# Patient Record
Sex: Male | Born: 1985 | Race: White | Hispanic: No | Marital: Married | Smoking: Former smoker
Health system: Southern US, Community
[De-identification: ages and names within clinical notes are randomized; demographics above are authoritative.]

## PROBLEM LIST (undated history)

## (undated) DIAGNOSIS — S069X9A Unspecified intracranial injury with loss of consciousness of unspecified duration, initial encounter: Secondary | ICD-10-CM

## (undated) DIAGNOSIS — G039 Meningitis, unspecified: Secondary | ICD-10-CM

## (undated) DIAGNOSIS — M549 Dorsalgia, unspecified: Secondary | ICD-10-CM

## (undated) DIAGNOSIS — R569 Unspecified convulsions: Secondary | ICD-10-CM

## (undated) DIAGNOSIS — G8929 Other chronic pain: Secondary | ICD-10-CM

---

## 2006-09-12 ENCOUNTER — Emergency Department: Payer: Self-pay

## 2007-06-23 ENCOUNTER — Emergency Department: Payer: Self-pay | Admitting: Emergency Medicine

## 2009-04-07 DIAGNOSIS — S069XAA Unspecified intracranial injury with loss of consciousness status unknown, initial encounter: Secondary | ICD-10-CM

## 2009-04-07 DIAGNOSIS — S069X9A Unspecified intracranial injury with loss of consciousness of unspecified duration, initial encounter: Secondary | ICD-10-CM

## 2009-04-07 HISTORY — PX: BRAIN SURGERY: SHX531

## 2009-04-07 HISTORY — DX: Unspecified intracranial injury with loss of consciousness status unknown, initial encounter: S06.9XAA

## 2009-04-07 HISTORY — DX: Unspecified intracranial injury with loss of consciousness of unspecified duration, initial encounter: S06.9X9A

## 2009-08-06 ENCOUNTER — Emergency Department: Payer: Self-pay | Admitting: Emergency Medicine

## 2009-08-15 ENCOUNTER — Emergency Department: Payer: Self-pay

## 2010-03-23 ENCOUNTER — Emergency Department: Payer: Self-pay | Admitting: Emergency Medicine

## 2010-05-08 ENCOUNTER — Emergency Department: Payer: Self-pay

## 2012-11-18 IMAGING — CT CT HEAD WITHOUT CONTRAST
2 series · 15 of 30 positions shown, 19 images · non-contrast
Comparison: none

REASON FOR EXAM: recent cranitomy Right - today: slurred speech,
parethesia/weakness on L (Lally Delacruz
COMMENTS:

[Series 2: without · axial · non-contrast · 0.44mm/px · z∈[+480,+604]mm · 13 of 31 slices shown, 17 images]
[im 3/31  brain]
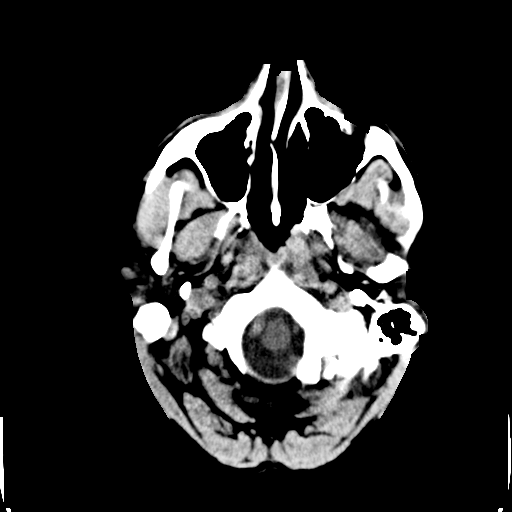
[im 3/31  bone]
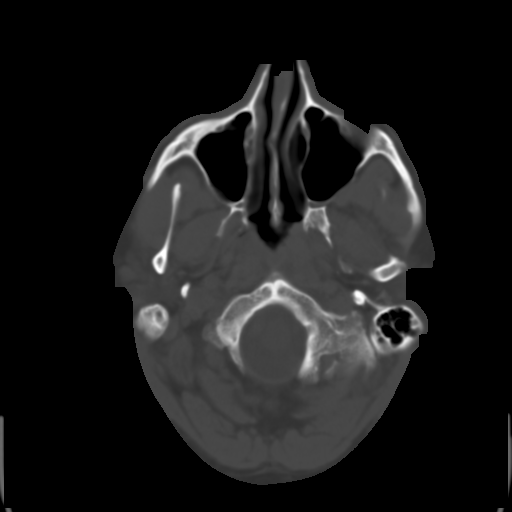
[im 5/31  brain]
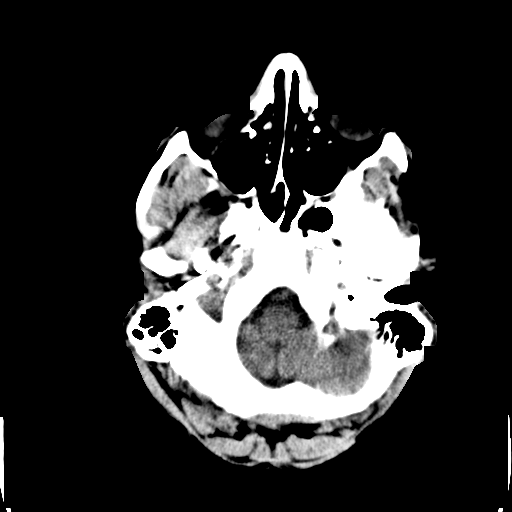
[im 7/31  brain]
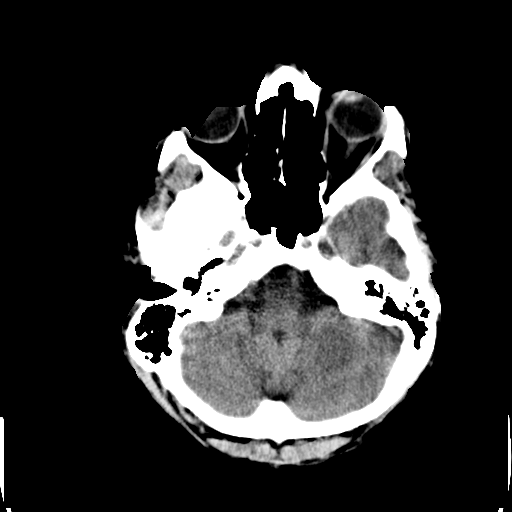
[im 9/31  brain]
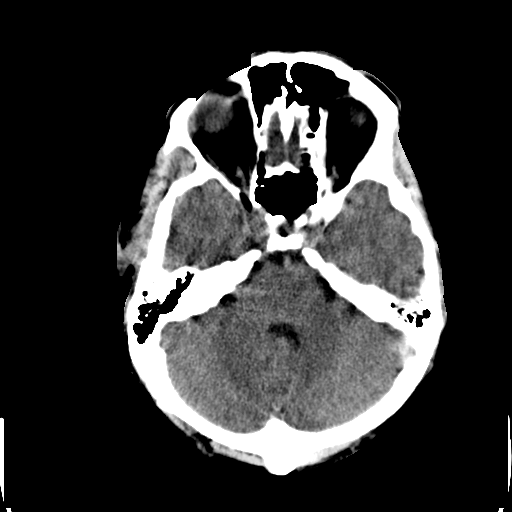
[im 11/31  brain]
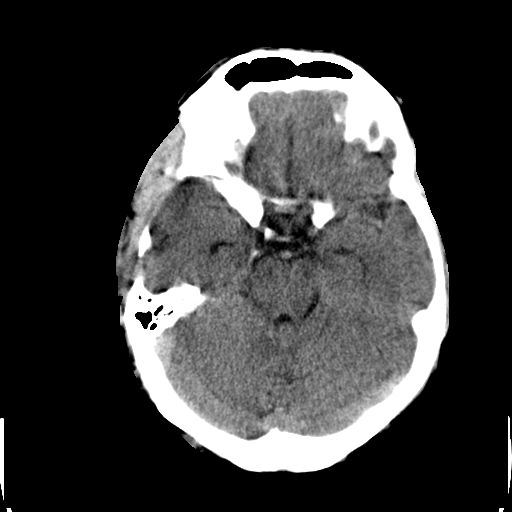
[im 11/31  bone]
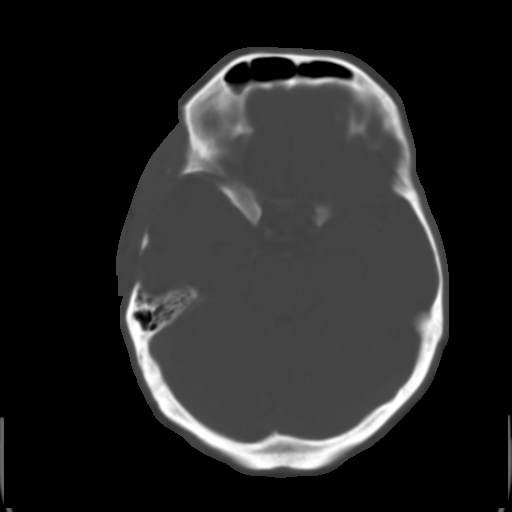
[im 13/31  brain]
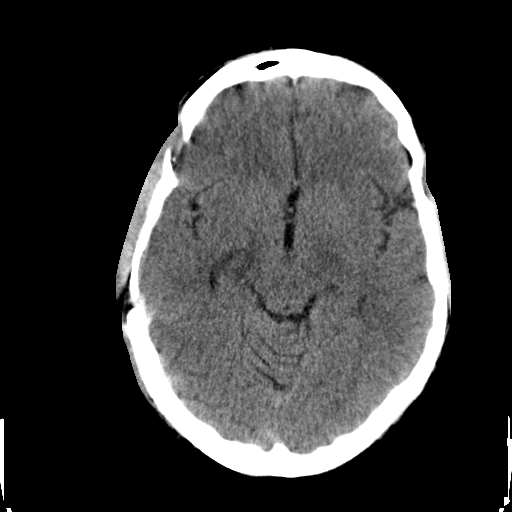
[im 16/31  brain]
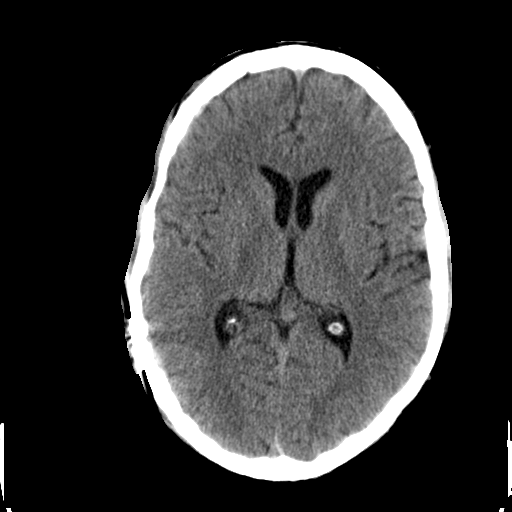
[im 18/31  brain]
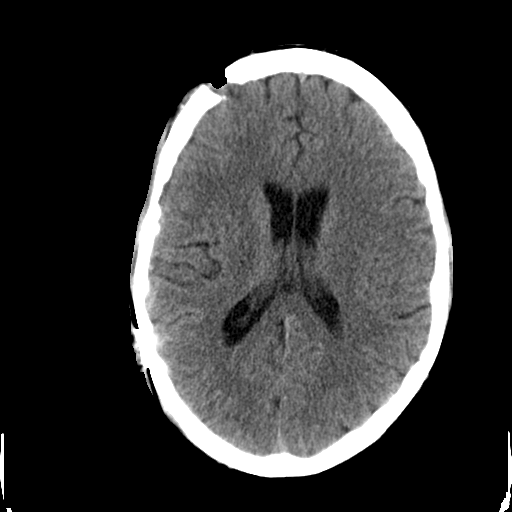
[im 20/31  brain]
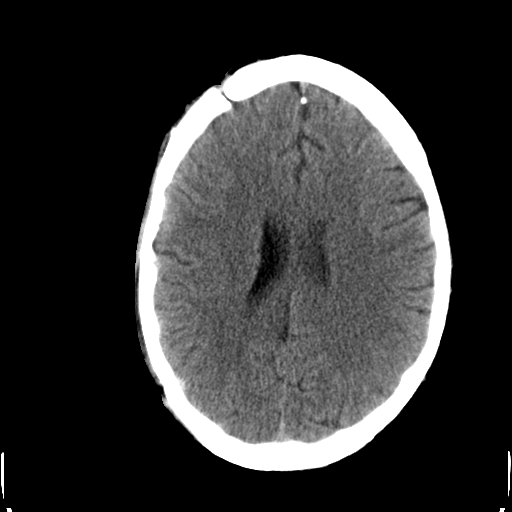
[im 20/31  bone]
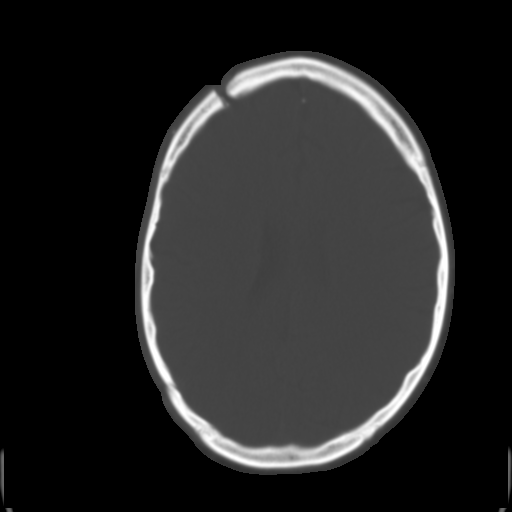
[im 22/31  brain]
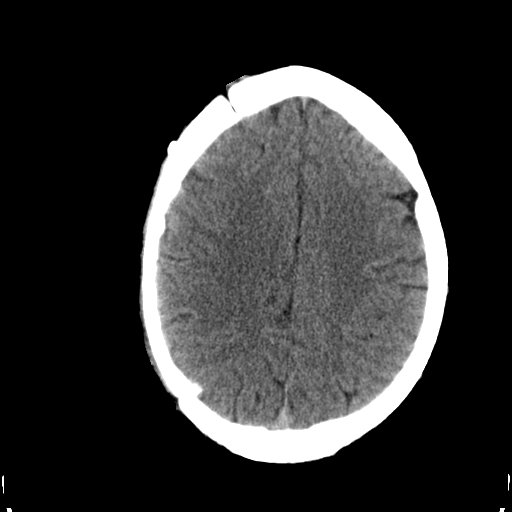
[im 24/31  brain]
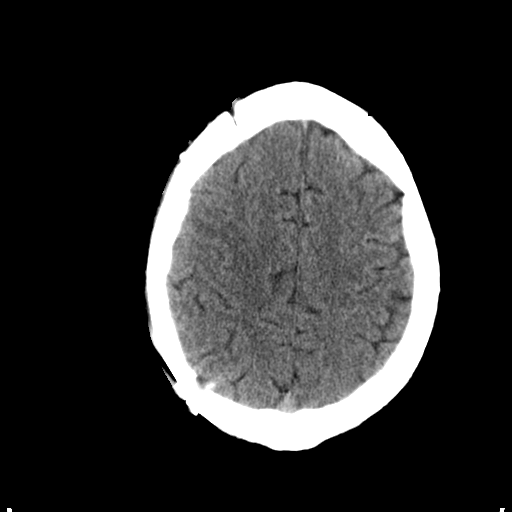
[im 26/31  brain]
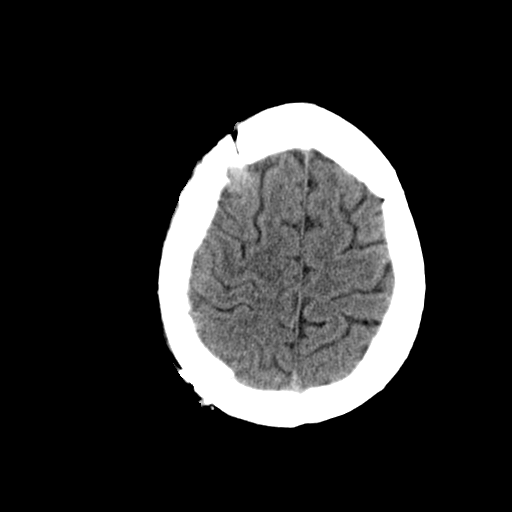
[im 28/31  brain]
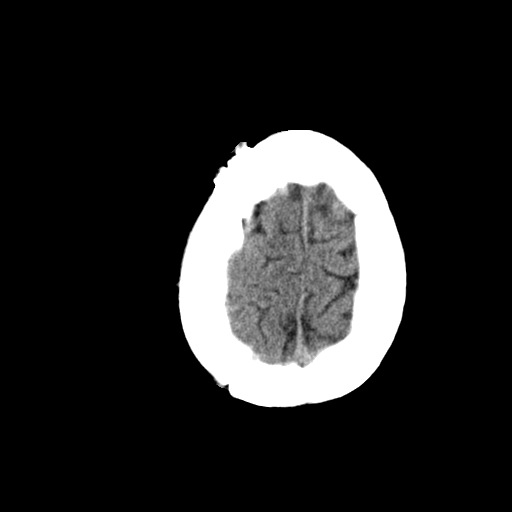
[im 28/31  bone]
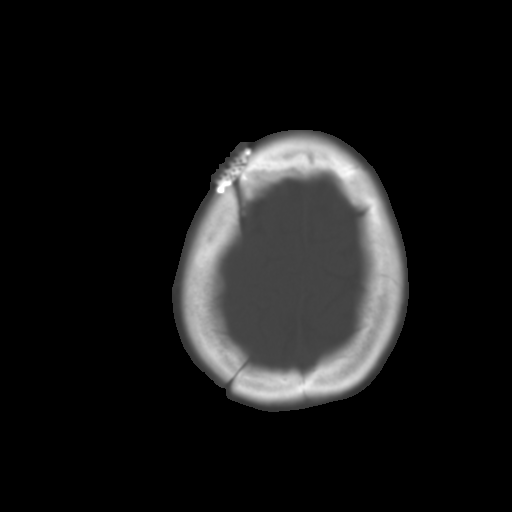

[Series 3: bone · axial · 0.44mm/px · z∈[+480,+500]mm · 2 of 31 slices shown]
[im 3/31  bone]
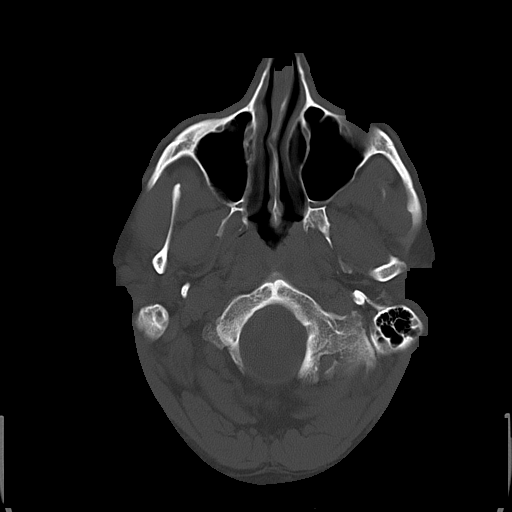
[im 7/31  bone]
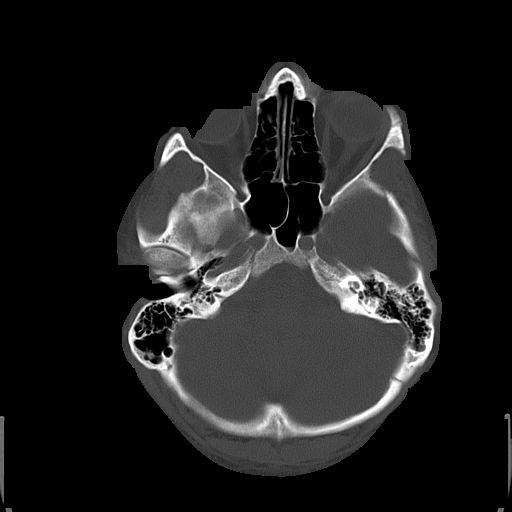

[15 of 30 positions shown; findings below may reference images not displayed]

PROCEDURE:     CT  - CT HEAD WITHOUT CONTRAST  - March 23, 2010 [DATE]

RESULT:     Axial CT scanning was performed through the brain at 5 mm
intervals and slice thicknesses.

The patient has recently undergone surgery at [HOSPITAL] and has evidence of
craniectomy on the right.

The ventricles are normal in size and position. There is no intracranial
hemorrhage. I see no subdural hygroma. I do not see findings to suggest an
evolving ischemic infarction. The cerebellum and brainstem exhibit no acute
abnormality. There is no intracranial mass effect. The calvarium exhibits no
evidence of a depressed fracture. The observed portions of the paranasal
sinuses and mastoid air cells are clear.
IMPRESSION: 1. I do not see evidence of acute intracranial hemorrhage or significant
residual subdural blood.
2. There is no shift of the midline nor hydrocephalus.
3. I see no finding to suggest an evolving ischemic infarction.

## 2012-12-16 ENCOUNTER — Emergency Department: Payer: Self-pay | Admitting: Emergency Medicine

## 2012-12-16 DIAGNOSIS — S0510XA Contusion of eyeball and orbital tissues, unspecified eye, initial encounter: Secondary | ICD-10-CM | POA: Diagnosis not present

## 2012-12-16 DIAGNOSIS — S0230XA Fracture of orbital floor, unspecified side, initial encounter for closed fracture: Secondary | ICD-10-CM | POA: Diagnosis not present

## 2012-12-16 DIAGNOSIS — S0280XA Fracture of other specified skull and facial bones, unspecified side, initial encounter for closed fracture: Secondary | ICD-10-CM | POA: Diagnosis not present

## 2012-12-24 DIAGNOSIS — F919 Conduct disorder, unspecified: Secondary | ICD-10-CM | POA: Diagnosis not present

## 2013-01-19 DIAGNOSIS — F919 Conduct disorder, unspecified: Secondary | ICD-10-CM | POA: Diagnosis not present

## 2013-02-16 DIAGNOSIS — F919 Conduct disorder, unspecified: Secondary | ICD-10-CM | POA: Diagnosis not present

## 2013-03-09 DIAGNOSIS — F919 Conduct disorder, unspecified: Secondary | ICD-10-CM | POA: Diagnosis not present

## 2013-04-27 DIAGNOSIS — F919 Conduct disorder, unspecified: Secondary | ICD-10-CM | POA: Diagnosis not present

## 2013-04-28 DIAGNOSIS — F919 Conduct disorder, unspecified: Secondary | ICD-10-CM | POA: Diagnosis not present

## 2013-07-29 ENCOUNTER — Encounter (HOSPITAL_COMMUNITY): Payer: Self-pay | Admitting: Emergency Medicine

## 2013-07-29 ENCOUNTER — Emergency Department (HOSPITAL_COMMUNITY)
Admission: EM | Admit: 2013-07-29 | Discharge: 2013-07-29 | Disposition: A | Discharge status: Home / Self Care | Payer: Medicare Other | Attending: Emergency Medicine | Admitting: Emergency Medicine

## 2013-07-29 DIAGNOSIS — Y9389 Activity, other specified: Secondary | ICD-10-CM | POA: Insufficient documentation

## 2013-07-29 DIAGNOSIS — IMO0002 Reserved for concepts with insufficient information to code with codable children: Secondary | ICD-10-CM | POA: Insufficient documentation

## 2013-07-29 DIAGNOSIS — M545 Low back pain, unspecified: Secondary | ICD-10-CM | POA: Diagnosis not present

## 2013-07-29 DIAGNOSIS — Z87820 Personal history of traumatic brain injury: Secondary | ICD-10-CM | POA: Diagnosis not present

## 2013-07-29 DIAGNOSIS — G8929 Other chronic pain: Secondary | ICD-10-CM | POA: Insufficient documentation

## 2013-07-29 DIAGNOSIS — Z87891 Personal history of nicotine dependence: Secondary | ICD-10-CM | POA: Diagnosis not present

## 2013-07-29 DIAGNOSIS — Y92410 Unspecified street and highway as the place of occurrence of the external cause: Secondary | ICD-10-CM | POA: Diagnosis not present

## 2013-07-29 DIAGNOSIS — G40909 Epilepsy, unspecified, not intractable, without status epilepticus: Secondary | ICD-10-CM | POA: Diagnosis not present

## 2013-07-29 DIAGNOSIS — M549 Dorsalgia, unspecified: Secondary | ICD-10-CM

## 2013-07-29 HISTORY — DX: Dorsalgia, unspecified: M54.9

## 2013-07-29 HISTORY — DX: Meningitis, unspecified: G03.9

## 2013-07-29 HISTORY — DX: Other chronic pain: G89.29

## 2013-07-29 HISTORY — DX: Unspecified intracranial injury with loss of consciousness of unspecified duration, initial encounter: S06.9X9A

## 2013-07-29 HISTORY — DX: Unspecified convulsions: R56.9

## 2013-07-29 MED ORDER — DIAZEPAM 5 MG PO TABS: 5.0000 mg | ORAL_TABLET | Freq: Two times a day (BID) | ORAL | Status: DC

## 2013-07-29 MED ORDER — NAPROXEN 500 MG PO TABS
500.0000 mg | ORAL_TABLET | Freq: Two times a day (BID) | ORAL | Status: DC
Start: 2013-07-29 — End: 2013-11-12

## 2013-07-29 MED ORDER — NAPROXEN 250 MG PO TABS
500.0000 mg | ORAL_TABLET | Freq: Once | ORAL | Status: AC
  Administered 2013-07-29: 500 mg via ORAL
  Filled 2013-07-29: qty 2

## 2013-07-29 MED ORDER — DIAZEPAM 5 MG PO TABS
10.0000 mg | ORAL_TABLET | Freq: Once | ORAL | Status: AC
  Administered 2013-07-29: 10 mg via ORAL
  Filled 2013-07-29: qty 2
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
## 2013-08-02 NOTE — ED Provider Notes (Signed)
Medical screening examination/treatment/procedure(s) were performed by non-physician practitioner and as supervising physician I was immediately available for consultation/collaboration.   EKG Interpretation None       Marvel Mcphillips, MD 08/02/13 1444 

## 2013-09-30 DIAGNOSIS — Z9889 Other specified postprocedural states: Secondary | ICD-10-CM | POA: Diagnosis not present

## 2013-11-12 ENCOUNTER — Emergency Department (HOSPITAL_COMMUNITY)
Admission: EM | Admit: 2013-11-12 | Discharge: 2013-11-12 | Disposition: A | Discharge status: Home / Self Care | Payer: Medicare Other | Attending: Emergency Medicine | Admitting: Emergency Medicine

## 2013-11-12 ENCOUNTER — Encounter (HOSPITAL_COMMUNITY): Payer: Self-pay | Admitting: Emergency Medicine

## 2013-11-12 ENCOUNTER — Emergency Department (HOSPITAL_COMMUNITY): Payer: Medicare Other

## 2013-11-12 DIAGNOSIS — G8929 Other chronic pain: Secondary | ICD-10-CM | POA: Diagnosis not present

## 2013-11-12 DIAGNOSIS — Z8639 Personal history of other endocrine, nutritional and metabolic disease: Secondary | ICD-10-CM | POA: Insufficient documentation

## 2013-11-12 DIAGNOSIS — Z87891 Personal history of nicotine dependence: Secondary | ICD-10-CM | POA: Insufficient documentation

## 2013-11-12 DIAGNOSIS — Z862 Personal history of diseases of the blood and blood-forming organs and certain disorders involving the immune mechanism: Secondary | ICD-10-CM | POA: Insufficient documentation

## 2013-11-12 DIAGNOSIS — R0989 Other specified symptoms and signs involving the circulatory and respiratory systems: Principal | ICD-10-CM | POA: Insufficient documentation

## 2013-11-12 DIAGNOSIS — R0609 Other forms of dyspnea: Secondary | ICD-10-CM | POA: Diagnosis not present

## 2013-11-12 DIAGNOSIS — R0602 Shortness of breath: Secondary | ICD-10-CM | POA: Diagnosis not present

## 2013-11-12 DIAGNOSIS — Z79899 Other long term (current) drug therapy: Secondary | ICD-10-CM | POA: Diagnosis not present

## 2013-11-12 DIAGNOSIS — Z87820 Personal history of traumatic brain injury: Secondary | ICD-10-CM | POA: Diagnosis not present

## 2013-11-12 DIAGNOSIS — R569 Unspecified convulsions: Secondary | ICD-10-CM | POA: Diagnosis not present

## 2013-11-12 DIAGNOSIS — R0600 Dyspnea, unspecified: Secondary | ICD-10-CM

## 2013-11-12 DIAGNOSIS — R079 Chest pain, unspecified: Secondary | ICD-10-CM | POA: Diagnosis not present

## 2013-11-12 LAB — COMPREHENSIVE METABOLIC PANEL
ALBUMIN: 4.2 g/dL (ref 3.5–5.2)
ALT: 32 U/L (ref 0–53)
ANION GAP: 15 (ref 5–15)
AST: 25 U/L (ref 0–37)
Alkaline Phosphatase: 73 U/L (ref 39–117)
BUN: 20 mg/dL (ref 6–23)
CALCIUM: 9.2 mg/dL (ref 8.4–10.5)
CO2: 22 meq/L (ref 19–32)
CREATININE: 1.12 mg/dL (ref 0.50–1.35)
Chloride: 105 mEq/L (ref 96–112)
GFR calc Af Amer: >90 mL/min (ref >90)
GFR, EST NON AFRICAN AMERICAN: 87 mL/min — AB (ref >90)
Glucose, Bld: 78 mg/dL (ref 70–99)
Potassium: 3.8 mEq/L (ref 3.7–5.3)
Sodium: 142 mEq/L (ref 137–147)
Total Bilirubin: 0.6 mg/dL (ref 0.3–1.2)
Total Protein: 7.3 g/dL (ref 6.0–8.3)

## 2013-11-12 LAB — I-STAT TROPONIN, ED: TROPONIN I, POC: 0 ng/mL (ref 0.00–0.08)

## 2013-11-12 LAB — CBC WITH DIFFERENTIAL/PLATELET
BASOS ABS: 0 10*3/uL (ref 0.0–0.1)
Basophils Relative: 1 % (ref 0–1)
Eosinophils Absolute: 0.2 10*3/uL (ref 0.0–0.7)
Eosinophils Relative: 3 % (ref 0–5)
HCT: 44.8 % (ref 39.0–52.0)
Hemoglobin: 15.4 g/dL (ref 13.0–17.0)
LYMPHS PCT: 28 % (ref 12–46)
Lymphs Abs: 2 10*3/uL (ref 0.7–4.0)
MCH: 30.3 pg (ref 26.0–34.0)
MCHC: 34.4 g/dL (ref 30.0–36.0)
MCV: 88.2 fL (ref 78.0–100.0)
Monocytes Absolute: 0.5 10*3/uL (ref 0.1–1.0)
Monocytes Relative: 7 % (ref 3–12)
NEUTROS ABS: 4.5 10*3/uL (ref 1.7–7.7)
Neutrophils Relative %: 61 % (ref 43–77)
PLATELETS: 199 10*3/uL (ref 150–400)
RBC: 5.08 MIL/uL (ref 4.22–5.81)
RDW: 12.8 % (ref 11.5–15.5)
WBC: 7.3 10*3/uL (ref 4.0–10.5)

## 2013-11-12 NOTE — ED Notes (Signed)
He had a traumatic brain injury in 2011

## 2013-11-12 NOTE — Discharge Instructions (Signed)
Call for a follow up appointment with a Family or Primary Care Provider.  °Return if Symptoms worsen.   °Take medication as prescribed.  ° ° °Emergency Department Resource Guide °1) Find a Doctor and Pay Out of Pocket °Although you won't have to find out who is covered by your insurance plan, it is a good idea to ask around and get recommendations. You will then need to call the office and see if the doctor you have chosen will accept you as a new patient and what types of options they offer for patients who are self-pay. Some doctors offer discounts or will set up payment plans for their patients who do not have insurance, but you will need to ask so you aren't surprised when you get to your appointment. ° °2) Contact Your Local Health Department °Not all health departments have doctors that can see patients for sick visits, but many do, so it is worth a call to see if yours does. If you don't know where your local health department is, you can check in your phone book. The CDC also has a tool to help you locate your state's health department, and many state websites also have listings of all of their local health departments. ° °3) Find a Walk-in Clinic °If your illness is not likely to be very severe or complicated, you may want to try a walk in clinic. These are popping up all over the country in pharmacies, drugstores, and shopping centers. They're usually staffed by nurse practitioners or physician assistants that have been trained to treat common illnesses and complaints. They're usually fairly quick and inexpensive. However, if you have serious medical issues or chronic medical problems, these are probably not your best option. ° °No Primary Care Doctor: °- Call [facility] at  832-8000 - they can help you locate a primary care doctor that  accepts your insurance, provides certain services, etc. °- Physician Referral Service- 1-800-533-3463 ° °Chronic Pain Problems: °Organization         Address  Phone    Notes  °[facility]  (336) 297-2271 Patients need to be referred by their primary care doctor.  ° °Medication Assistance: °Organization         Address  Phone   Notes  °[place] [facility] [address] °[place], [place] [address] (336) 641-8030 --Must be a resident of [place] °-- Must have NO insurance coverage whatsoever (no Medicaid/ Medicare, etc.) °-- The pt. MUST have a primary care doctor that directs their care regularly and follows them in the community °  °[facility]  (866) 331-1348   °[employer]  (888) 892-1162   ° °Agencies that provide inexpensive medical care: °Organization         Address  Phone   Notes  °[facility]  (336) 832-8035   °[facility]    (336) 832-7272   °[facility] [address] °[place], [place] [address] (336) 832-4777   °[facility] [address], °[place] (336) 271-4999   °[facility]    (336) 373-0678   °[facility]    (336) 272-1050   °[facility] ° [address], [place] Phone:  (336) 832-4444, Fax:  (336) 832-4440 Hours of Operation:  9 am - 6 pm, M-F.  Also accepts Medicaid/Medicare and self-pay.  °[facility] ° [address], [place]   Phone: (336) 832-3150, Fax: (336) 832-3151. Hours of Operation:  8:30 am - 5:30 pm, M-F.  Also accepts Medicaid and self-pay.  °[facility] [place] [address], [place] Phone: (336) 878-6027   °[facility] [address], [place], [place] (336)723-1848, Ext. 123 Mondays & Thursdays: 7-9 AM.  First 15 patients are seen on a first come, first serve basis. °  ° °Medicaid-accepting [place] Providers: ° °Organization         Address  Phone   Notes  °[facility] [address], [place] (336) 641-2100 Also accepts self-pay patients.  °[facility]  [address], [place] ° (336) 856-9996   °[facility] [address], [place] (336) 288-8857   °[facility] [address] [place] [address], [place] (336) 299-7000   °Veita Bland [address], [place]  ° (336) 373-1557 Only accepts [place] Access Medicaid patients after they have their name applied to their card.  ° °Self-Pay (no insurance) in [place]: ° °Organization         Address  Phone   Notes  °Sickle Cell Patients, [facility] [address], [place] (336) 832-1970   °[facility] [address], [place] (336) 832-4400   °[facility] ° [address], [place] (336) 992-4800   °[facility]/Dr. Osei-Bonsu ° [address] [place] [address], [place] or [address], [place] (336) 841-8500 Phone number for both [place] and [place] locations is the same.  °[facility] [address], [place] (336) 299-0000   °[facility] [address] [place] [address], [place] or [address] (336) 852-7530 °(336) 878-2260   °[facility] [address], [place] (336) 350-1642, phone; (336) 294-5005, fax Sees patients 1st and 3rd Saturday of every month.  Must not qualify for public or private insurance (i.e. Medicaid, Medicare, [place] Health Choice, Veterans' Benefits) • Household income should be no more than 200% of the poverty level •The clinic cannot treat you if you are pregnant or think you are pregnant • Sexually transmitted diseases are not treated at the clinic.  ° ° °Dental Care: °Organization         Address  Phone  Notes  °[facility] [address], [place] (336) 641-6152 Accepts children up to age 21 who are enrolled in Medicaid or [place] Health Choice; pregnant women with a Medicaid card; and children who have  applied for Medicaid or [place] Health Choice, but were declined, whose parents can pay a reduced fee at time of service.  °[facility] [place]  [address], [place] (336) 641-7733 Accepts children up to age 21 who are enrolled in Medicaid or [place] Health Choice; pregnant women with a Medicaid card; and children who have applied for Medicaid or [place] Health Choice, but were declined, whose parents can pay a reduced fee at time of service.  °[facility] ° [address], [place] (336) 641-4533 Patients are seen by appointment only. Walk-ins are not accepted. [facility] will see patients 18 years of age and older. °Monday - Tuesday (8am-5pm) °Most Wednesdays (8:30-5pm) °$30 per visit, cash only  °[facility] ° [address], [place] (336) 641-4533 Patients are seen by appointment only. Walk-ins are not accepted. [facility] will see patients 18 years of age and older. °One Wednesday Evening (Monthly: Volunteer Based).  $30 per visit, cash only  °[facility]  (919) 537-3737 for adults; Children under age 4, call Graduate Pediatric Dentistry at (919) [phone]. Children aged 4-14, please call (919) [phone] to request a pediatric application. ° Dental services are provided in all areas of dental care including fillings, crowns and bridges, complete and partial dentures, implants, gum treatment, root canals, and extractions. Preventive care is also provided. Treatment is provided to both adults and children. °Patients are selected via a lottery and there is often a waiting list. °  °[facility] [address], °[place] ° (336) 763-8833 [email or web address] °  °[facility] [address], [place], [place] (336)723-1848, Ext. 123 Second and Fourth Thursday of each month, opens at 6:30 AM; Clinic ends at 9 AM.  Patients are seen on a first-come first-served basis, and a  limited number are seen during each clinic.  ° °[facility] ° [address], [place], [place] (336) 723-7904   Eligibility Requirements °You must have lived in [place], [place], or [place] counties for at least the last three months. °  You cannot be eligible for state or federal sponsored healthcare insurance, including Veterans Administration, Medicaid, or Medicare. °  You generally cannot be eligible for healthcare insurance through your employer.  °  How to apply: °Eligibility screenings are held every Tuesday and Wednesday afternoon from 1:00 pm until 4:00 pm. You do not need an appointment for the interview!  °[facility] [address], [place], [place] 336-631-2330   °[facility]  336-342-8273   °[facility]  336-703-3100   °[facility]  336-570-6415   ° °Behavioral Health Resources in the Community: °Intensive Outpatient Programs °Organization         Address  Phone  Notes  °[facility] [address], [place], [place] 336-878-6098   °[facility] [address], [place], [place] 336-832-9800   °[facility] [address], [place], [place] ° 336-882-2125   °[facility] [address],  °[place], [place] 1-800-853-5163 or 336-641-4981   °Substance Abuse Resources °Organization         Address  Phone  Notes  °[facility]  336-882-2125   °[facility]  336-784-9470   °[facility]  336-285-9073   °[facility]  336-845-3988   °Residential & Outpatient Substance Abuse Program  1-800-659-3381   °Psychological Services °Organization         Address  Phone  Notes  °[facility]  336- 832-9600   °[facility]  336- 378-7881   °[facility] [address], [place] 1-800-853-5163 or 336-641-4981   ° °Mobile Crisis Teams °Organization          Address  Phone  Notes  °[facility], Mobile Crisis Care Unit  1-877-626-1772   °Assertive °[facility] ° [address] [place], [place] 336-834-9664   °Sharon DeEsch [address] °[place] 336-554-5454   ° °Self-Help/Support Groups °Organization         Address  Phone             Notes  °[facility] [place] - variety of   support groups  336- 373-1402 Call for more information  °Narcotics Anonymous (NA), [facility] [address], °[place]  2 meetings at this location  ° °Residential Treatment Programs °Organization         Address  Phone  Notes  °[facility] [address],    °[place]  1-866-801-8205   °[facility] ° [address], [place], [place] 704-293-8524   °[facility] [address], [place] 336-845-3988 Admissions: 8am-3pm M-F  °[facility] [address]    °[place], [place] 336-841-1104   °[facility] [address], [place], [place] 336-379-7146   °[facility] [address]  °[place], [place] 336-285-9073   °[facility] - Intensive Outpatient [address], [place], [place] 336-852-3033   °[facility] [address]  °[place], [place] 1-877-615-2722 or 336-784-9470   °[facility] [address] [place], [place] 336-227-7417 Accepts Medicaid  °[facility] [address]  ° [place] 1-800-659-3381 Substance Abuse/Addiction Treatment  ° °[place] Behavioral Health Resources °Organization         Address  Phone  Notes  °[facility]  (888) 581-9988   °Julie Brannon, PhD [address] [place], [place]   (336) 349-5553 or (336) 951-0000   °[facility]   [address] °[place], [place] (336) 349-4454   °[facility] [address], [place], [place] (336) 342-8316 Insurance/Medicaid/sponsorship  through [facility]  °[facility] [address]                                    [place], [place] (336) 342-8316 Therapy/tele-psych/case  °[facility] [address]  ° [place], [place] (336) 349-2233    °Dr. Arfeen  (336) 349-4544   °[facility] [place]  [employer] [place] [facility] 1) [address], [place] °2) [address], [place] °3)  [address], [place] (336) 349-3220 °(336) 342-7768 ° °(336) 342-8140   °[place] [facility] (336) 342-1394 or (336) 342-3537 (After Hours)    ° °

## 2013-11-12 NOTE — ED Provider Notes (Signed)
CSN: [id number]     Arrival date & time 11/12/13  1907 History   First MD Initiated Contact with Patient 11/12/13 1928     Chief Complaint  Patient presents with  . Shortness of Breath     (Consider location/radiation/quality/duration/timing/severity/associated sxs/prior Treatment) HPI Comments: The patient is a 28 year old male past history to traumatic brain injury presents emergency room chief complaint of worsening shortness of breath for approximately 2-3 days. He reports gradual onset of dyspnea.  He reports left sided chest discomfort today while en route to ED.  He denies fever, chills, cough, palpitations, lower extremity edema. No recent travel, family history or personal history of DVT/PE, lower extremity swelling, smoking (last tobacco use 4 years), cancer, or exogenous estrogen.   Patient is a 28 y.o. male presenting with shortness of breath. The history is provided by the patient. No language interpreter was used.  Shortness of Breath Associated symptoms: chest pain   Associated symptoms: no abdominal pain and no fever     Past Medical History  Diagnosis Date  . TBI (traumatic brain injury) 2011  . Meningitis     at age 89  . Chronic back pain   . 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 Brain surgery  2011   No family history on file. History  Substance Use Topics  . Smoking status: Former Games developermoker  . Smokeless tobacco: Not on file  . Alcohol Use: No    Review of Systems  Constitutional: Negative for fever and chills.  Respiratory: Positive for shortness of breath.   Cardiovascular: Positive for chest pain. Negative for palpitations and leg swelling.  Gastrointestinal: Negative for abdominal pain.      Allergies  Review of patient's allergies indicates no known allergies.  Home Medications   Prior to Admission medications   Medication Sig Start Date End Date Taking? Authorizing Provider  albuterol (PROVENTIL  HFA;VENTOLIN HFA) 108 (90 BASE) MCG/ACT inhaler Inhale 1-2 puffs into the lungs every 6 (six) hours as needed for wheezing or shortness of breath.   Yes Historical Provider, MD  donepezil (ARICEPT) 10 MG tablet Take 10 mg by mouth daily.    Yes Historical Provider, MD  levETIRAcetam (KEPPRA) 500 MG tablet Take 1,000 mg by mouth 2 (two) times daily.    Yes Historical Provider, MD   BP 119/77  Pulse 84  Temp(Src) 98.4 F (36.9 C) (Oral)  Resp 18  SpO2 98% Physical Exam  Nursing note and vitals reviewed. Constitutional: He appears well-developed and well-nourished. No distress.  HENT:  Head: Normocephalic.  Large well healed scar to right midsagittal scalp.  Neck: Neck supple.  Cardiovascular: Normal rate and regular rhythm.   No lower extremity edema  Pulmonary/Chest: Tachypnea noted. No respiratory distress. He has decreased breath sounds in the right middle field and the right lower field. He has no wheezes. He has no rhonchi. He exhibits no tenderness.  Patient is able to speak in short sentences. Respiration rate 20-24 while in room.    Abdominal: Soft. There is no tenderness. There is no rebound and no guarding.  Skin: Skin is warm and dry. He is not diaphoretic.  Psychiatric: He has a normal mood and affect.    ED Course  Procedures (including critical care time) Labs Review Labs Reviewed  COMPREHENSIVE METABOLIC PANEL - Abnormal; Notable for the following:    GFR calc non Af Amer 87 (*)    All other components  within normal limits  CBC WITH DIFFERENTIAL  I-STAT TROPOININ, ED    Imaging Review Dg Chest 2 View  11/12/2013   CLINICAL DATA:  Shortness of breath, left inferior chest pain for 3 days, quit smoking 4 years ago  EXAM: CHEST  2 VIEW  COMPARISON:  None.  FINDINGS: The heart size and mediastinal contours are within normal limits. Both lungs are clear. The visualized skeletal structures are unremarkable.  IMPRESSION: No active cardiopulmonary disease.   Electronically  Signed   By: Esperanza Heir M.D.   On: 11/12/2013 21:47     Date: 11/12/2013  Rate: 78  Rhythm: normal sinus rhythm  QRS Axis: normal  Intervals: normal  ST/T Wave abnormalities: normal  Conduction Disutrbances:none  Narrative Interpretation:   Old EKG Reviewed:        MDM   Final diagnoses:  Dyspnea   Patient presents with shortness of breath, right lower and middle lung fields with decrease breath sounds. X-ray ordered to evaluate for possible pleural effusion. Patient is able to speak in short sentences SpO2 99-100% on room air. PERC negative. Negative workup. X-ray without correlating findings. The patient was also evaluated by Dr. Romeo Apple during this encounter. Patient resting comfortably in room, reports resolution of symptoms at this time. Speaking in complete and full sentences. Advised patient followup with PCP. Discussed lab results, imaging results, and treatment plan with the patient. Return precautions given. Reports understanding and no other concerns at this time.  Patient is stable for discharge at this time.    Mellody Drown, PA-C 11/13/13 [phone]

## 2013-11-12 NOTE — ED Notes (Signed)
PT monitored by pulse ox, bp cuff, and 12-lead. 

## 2013-11-12 NOTE — ED Notes (Signed)
The pt is c/o sob for 2-3 days.  The pt is hyperventilating and c/o being dizzy he is not speaking his girlfriend is talking for him..George Ingram

## 2013-11-13 NOTE — ED Provider Notes (Signed)
Medical screening examination/treatment/procedure(s) were conducted as a shared visit with non-physician practitioner(s) and myself.  I personally evaluated the patient during the encounter.   EKG Interpretation None      I interviewed and examined the patient. Lungs are CTAB on my exam. Cardiac exam wnl. Abdomen soft.  Workup non-contrib. Wells/Perc neg. Will rec f/u w/ pcp.   Purvis SheffieldForrest Neils [place], MD 11/13/13 [phone]

## 2014-01-23 ENCOUNTER — Emergency Department (HOSPITAL_COMMUNITY): Payer: No Typology Code available for payment source

## 2014-01-23 ENCOUNTER — Encounter (HOSPITAL_COMMUNITY): Payer: Self-pay | Admitting: Emergency Medicine

## 2014-01-23 ENCOUNTER — Emergency Department (HOSPITAL_COMMUNITY)
Admission: EM | Admit: 2014-01-23 | Discharge: 2014-01-23 | Disposition: A | Discharge status: Home / Self Care | Payer: No Typology Code available for payment source | Attending: Emergency Medicine | Admitting: Emergency Medicine

## 2014-01-23 DIAGNOSIS — Z87891 Personal history of nicotine dependence: Secondary | ICD-10-CM | POA: Diagnosis not present

## 2014-01-23 DIAGNOSIS — G8929 Other chronic pain: Secondary | ICD-10-CM | POA: Insufficient documentation

## 2014-01-23 DIAGNOSIS — M25511 Pain in right shoulder: Secondary | ICD-10-CM | POA: Diagnosis not present

## 2014-01-23 DIAGNOSIS — G40909 Epilepsy, unspecified, not intractable, without status epilepticus: Secondary | ICD-10-CM | POA: Insufficient documentation

## 2014-01-23 DIAGNOSIS — S39012A Strain of muscle, fascia and tendon of lower back, initial encounter: Secondary | ICD-10-CM | POA: Insufficient documentation

## 2014-01-23 DIAGNOSIS — S4991XA Unspecified injury of right shoulder and upper arm, initial encounter: Secondary | ICD-10-CM | POA: Diagnosis not present

## 2014-01-23 DIAGNOSIS — Y9389 Activity, other specified: Secondary | ICD-10-CM | POA: Diagnosis not present

## 2014-01-23 DIAGNOSIS — Z87820 Personal history of traumatic brain injury: Secondary | ICD-10-CM | POA: Insufficient documentation

## 2014-01-23 DIAGNOSIS — M546 Pain in thoracic spine: Secondary | ICD-10-CM | POA: Diagnosis not present

## 2014-01-23 DIAGNOSIS — Y92410 Unspecified street and highway as the place of occurrence of the external cause: Secondary | ICD-10-CM | POA: Diagnosis not present

## 2014-01-23 DIAGNOSIS — Z79899 Other long term (current) drug therapy: Secondary | ICD-10-CM | POA: Insufficient documentation

## 2014-01-23 DIAGNOSIS — R079 Chest pain, unspecified: Secondary | ICD-10-CM | POA: Diagnosis not present

## 2014-01-23 DIAGNOSIS — M549 Dorsalgia, unspecified: Secondary | ICD-10-CM | POA: Diagnosis not present

## 2014-01-23 DIAGNOSIS — S3992XA Unspecified injury of lower back, initial encounter: Secondary | ICD-10-CM | POA: Diagnosis not present

## 2014-01-23 DIAGNOSIS — S161XXA Strain of muscle, fascia and tendon at neck level, initial encounter: Secondary | ICD-10-CM

## 2014-01-23 DIAGNOSIS — S199XXA Unspecified injury of neck, initial encounter: Secondary | ICD-10-CM | POA: Diagnosis present

## 2014-01-23 MED ORDER — IBUPROFEN 400 MG PO TABS
400.0000 mg | ORAL_TABLET | Freq: Once | ORAL | Status: AC
  Administered 2014-01-23: 400 mg via ORAL

## 2014-01-23 MED ORDER — IBUPROFEN 400 MG PO TABS: 400.0000 mg | ORAL_TABLET | Freq: Three times a day (TID) | ORAL | Status: DC | PRN

## 2014-01-23 MED ORDER — CYCLOBENZAPRINE HCL 10 MG PO TABS: 10.0000 mg | ORAL_TABLET | Freq: Two times a day (BID) | ORAL | Status: AC | PRN
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
## 2014-01-23 NOTE — ED Notes (Signed)
Will call registration because patient has concerns about insurance coverage.

## 2014-01-23 NOTE — Discharge Instructions (Signed)
Please call your doctor for a followup appointment within 24-48 hours. When you talk to your doctor please let them know that you were seen in the emergency department and have them acquire all of your records so that they can discuss the findings with you and formulate a treatment plan to fully care for your new and ongoing problems. Please call and set-up an appointment with Health and Wellness center  Please call and set-up an appointment with Orthopedics Please rest and stay hydrated Please take medications as prescribed and on a full stomach  While taking muscle relaxers there is to be no drinking alcohol, driving, operating any heavy machinery for these medications lead to drowsiness Please continue to monitor symptoms closely and if symptoms are to worsen or change (fever greater than 101, chills, sweating, nausea, vomiting, chest pain, shortness of breathe, difficulty breathing, weakness, numbness, tingling, worsening or changes to pain pattern, fall, injury, inability to control urine or bowel movements) please report back to the Emergency Department immediately.   Cervical Sprain A cervical sprain is when the tissues (ligaments) that hold the neck bones in place stretch or tear. HOME CARE   Put ice on the injured area.  Put ice in a plastic bag.  Place a towel between your skin and the bag.  Leave the ice on for 15-20 minutes, 3-4 times a day.  You may have been given a collar to wear. This collar keeps your neck from moving while you heal.  Do not take the collar off unless told by your doctor.  If you have long hair, keep it outside of the collar.  Ask your doctor before changing the position of your collar. You may need to change its position over time to make it more comfortable.  If you are allowed to take off the collar for cleaning or bathing, follow your doctor's instructions on how to do it safely.  Keep your collar clean by wiping it with mild soap and water. Dry it  completely. If the collar has removable pads, remove them every 1-2 days to hand wash them with soap and water. Allow them to air dry. They should be dry before you wear them in the collar.  Do not drive while wearing the collar.  Only take medicine as told by your doctor.  Keep all doctor visits as told.  Keep all physical therapy visits as told.  Adjust your work station so that you have good posture while you work.  Avoid positions and activities that make your problems worse.  Warm up and stretch before being active. GET HELP IF:  Your pain is not controlled with medicine.  You cannot take less pain medicine over time as planned.  Your activity level does not improve as expected. GET HELP RIGHT AWAY IF:   You are bleeding.  Your stomach is upset.  You have an allergic reaction to your medicine.  You develop new problems that you cannot explain.  You lose feeling (become numb) or you cannot move any part of your body (paralysis).  You have tingling or weakness in any part of your body.  Your symptoms get worse. Symptoms include:  Pain, soreness, stiffness, puffiness (swelling), or a burning feeling in your neck.  Pain when your neck is touched.  Shoulder or upper back pain.  Limited ability to move your neck.  Headache.  Dizziness.  Your hands or arms feel week, lose feeling, or tingle.  Muscle spasms.  Difficulty swallowing or chewing. MAKE SURE  YOU:   Understand these instructions.  Will watch your condition.  Will get help right away if you are not doing well or get worse. Document Released: 09/10/2007 Document Revised: 11/24/2012 Document Reviewed: 09/29/2012 [facility] Patient Information 2015 [place], [place]. This information is not intended to replace advice given to you by your health care provider. Make sure you discuss any questions you have with your health care provider.   Back Pain, Adult Back pain is very common. The pain often gets  better over time. The cause of back pain is usually not dangerous. Most people can learn to manage their back pain on their own.  HOME CARE   Stay active. Start with short walks on flat ground if you can. Try to walk farther each day.  Do not sit, drive, or stand in one place for more than 30 minutes. Do not stay in bed.  Do not avoid exercise or work. Activity can help your back heal faster.  Be careful when you bend or lift an object. Bend at your knees, keep the object close to you, and do not twist.  Sleep on a firm mattress. Lie on your side, and bend your knees. If you lie on your back, put a pillow under your knees.  Only take medicines as told by your doctor.  Put ice on the injured area.  Put ice in a plastic bag.  Place a towel between your skin and the bag.  Leave the ice on for 15-20 minutes, 03-04 times a day for the first 2 to 3 days. After that, you can switch between ice and heat packs.  Ask your doctor about back exercises or massage.  Avoid feeling anxious or stressed. Find good ways to deal with stress, such as exercise. GET HELP RIGHT AWAY IF:   Your pain does not go away with rest or medicine.  Your pain does not go away in 1 week.  You have new problems.  You do not feel well.  The pain spreads into your legs.  You cannot control when you poop (bowel movement) or pee (urinate).  Your arms or legs feel weak or lose feeling (numbness).  You feel sick to your stomach (nauseous) or throw up (vomit).  You have belly (abdominal) pain.  You feel like you may pass out (faint). MAKE SURE YOU:   Understand these instructions.  Will watch your condition.  Will get help right away if you are not doing well or get worse. Document Released: 09/10/2007 Document Revised: 06/16/2011 Document Reviewed: 07/26/2013 [facility] Patient Information 2015 [place], [place]. This information is not intended to replace advice given to you by your health care provider.  Make sure you discuss any questions you have with your health care provider.   Emergency Department Resource Guide 1) Find a Doctor and Pay Out of Pocket Although you won't have to find out who is covered by your insurance plan, it is a good idea to ask around and get recommendations. You will then need to call the office and see if the doctor you have chosen will accept you as a new patient and what types of options they offer for patients who are self-pay. Some doctors offer discounts or will set up payment plans for their patients who do not have insurance, but you will need to ask so you aren't surprised when you get to your appointment.  2) Contact Your Local Health Department Not all health departments have doctors that can see patients for sick visits,  but many do, so it is worth a call to see if yours does. If you don't know where your local health department is, you can check in your phone book. The CDC also has a tool to help you locate your state's health department, and many state websites also have listings of all of their local health departments.  3) Find a Walk-in Clinic If your illness is not likely to be very severe or complicated, you may want to try a walk in clinic. These are popping up all over the country in pharmacies, drugstores, and shopping centers. They're usually staffed by nurse practitioners or physician assistants that have been trained to treat common illnesses and complaints. They're usually fairly quick and inexpensive. However, if you have serious medical issues or chronic medical problems, these are probably not your best option.  No Primary Care Doctor: - Call Health Connect at  [phone] - they can help you locate a primary care doctor that  accepts your insurance, provides certain services, etc. - Physician Referral Service- [phone]  Chronic Pain Problems: Organization         Address  Phone   Notes  [facility]  [phone]  Patients need to be referred by their primary care doctor.   Medication Assistance: Organization         Address  Phone   Notes  [facility] Medication [facility] [address] [place] [place], [place] [address] [phone] --Must be a resident of [facility] -- Must have NO insurance coverage whatsoever (no Medicaid/ Medicare, etc.) -- The pt. MUST have a primary care doctor that directs their care regularly and follows them in the community   [facility]  [phone]   [employer]  [phone]    Agencies that provide inexpensive medical care: Organization         Address  Phone   Notes  [facility]  [phone]   [facility]    [phone]   [facility] [address] [place], [place] [address] [phone]   [facility] [address] [place] [place] [phone]   Planned Parenthood    [phone]   [facility]    [phone]   Community Health and [facility]  [address], [place] Phone:  [phone], Fax:  [phone] Hours of Operation:  9 am - 6 pm, M-F.  Also accepts Medicaid/Medicare and self-pay.  [facility]  [address], [place] Phone: [phone], Fax: [phone]. Hours of Operation:  8:30 am - 5:30 pm, M-F.  Also accepts Medicaid and self-pay.  [facility] [place] [address], [place] Phone: [phone]   [facility] [address] Natasha Bence [place], [place] [phone], Ext. [phone] Mondays & Thursdays: 7-9 AM.  First 15 patients are seen on a first come, first serve basis.    Medicaid-accepting [facility] Providers:  Organization         Address  Phone   Notes  [facility] [address], [place] [phone] Also accepts self-pay patients.  [facility] [address] Laurell Josephs [place], [place]  [phone]   [facility] [address], [place] [phone]   [facility] [address] [place] [phone]  Renaye Rakers [address], [place]   [phone] Only accepts [place] patients after they have their name applied to their card.   Self-Pay (no insurance) in [facility]:  Organization         Address  Phone   Notes  Sickle Cell Patients, [facility] [address] [place], [place] [phone]   [facility] [address] [place], [place] [phone]   [facility] [place]  [address], [place] [phone]   [facility]/Dr. Osei-Bonsu  [address] [place] or [address], [place] [phone] Phone number for both [place] and [place] locations is the same.  Urgent Medical and [facility] [address] [place] [phone]   [facility] [address], [place] or [address] [phone]   [facility] [address], [place] [phone], phone; [phone], fax Sees patients 1st and 3rd Saturday of every month.  Must not qualify for public or private insurance (i.e. Medicaid, Medicare, [place] Health Choice, Veterans' Benefits)  Household income should be no more than 200% of the poverty level The clinic cannot treat you if you are pregnant or think you are pregnant  Sexually transmitted diseases are not treated at the clinic.    Dental Care: Organization         Address  Phone  Notes  [facility] [address] [place], [place] [phone] Accepts children up to age 86 who are enrolled in [place] or [place] Health Choice; pregnant women with a Medicaid card; and children who have applied for Medicaid or [place] Health Choice, but were  declined, whose parents can pay a reduced fee at time of service.  [facility] Department of [facility]  [address], [place] [phone] Accepts children up to age 62 who are enrolled in [place] or [place] Health Choice; pregnant women with a Medicaid card; and children who have applied for Medicaid or [place] Health Choice, but were declined, whose parents can pay a reduced fee at time of service.  [facility]  [address] [place], [place] [phone] Patients are seen by appointment only. Walk-ins are not accepted. [facility] will see patients 3 years of age and older. Monday - Tuesday (8am-5pm) Most Wednesdays (8:30-5pm) $30 per visit, cash only  [facility] Adult Dental Access PRO[facility]  [address], [facility] [phone] Patients are seen by appointment only. Walk-ins are not accepted. [facility] will see patients 31 years of age and older. One Wednesday Evening (Monthly: Volunteer Based).  $30 per visit, cash only  [employer]  [phone] for adults; Children under age 12, call Graduate Pediatric Dentistry at [phone]. Children aged 79-14, please call [phone] to request a pediatric application.  Dental services are provided in all areas of dental care including fillings, crowns and bridges, complete and partial dentures, implants, gum treatment, root canals, and extractions. Preventive care is also provided. Treatment is provided to both adults and children. Patients are selected via a lottery and there is often a waiting list.   [facility] [address], [place]  [phone] [email or web address]   [facility] [address] [place], [place] [phone], Ext. [phone]  Second and Fourth Thursday of each month, opens at 6:30 AM; Clinic ends at 9 AM.  Patients are seen on a first-come first-served basis, and a limited number are seen during each clinic.    [facility]  [address] Ether Griffins [place], [place] [phone]   Eligibility Requirements You must have lived in [place], [place], or [place] counties for at least the last three months.   You cannot be eligible for state or federal sponsored [place], including [employer], [place], or [employer].   You generally cannot be eligible for healthcare insurance through your employer.    How to apply: Eligibility screenings are held every Tuesday and Wednesday afternoon from 1:00 pm until 4:00 pm. You do not need an appointment for the interview!  [facility] [address] [place], [place] [phone]   [facility]  [phone]   [facility] Health Department  [phone]   [facility] Health Department  [phone]    Behavioral Health Resources in the Community: Intensive Outpatient Programs Organization         Address  Phone  Notes  [facility] [address] [place], [place] [phone]   [facility] [address] [place], [place] [phone]   [facility] [address], [place], [place]  [phone]   [facility] Mental Health [address],  [place], [place] [phone] or [phone]   Substance Abuse Resources Organization         Address  Phone  Notes  [facility]  [phone]   [facility]  [phone]   [facility] [place]  [phone]   Floydene Flock  [phone]   Residential & Outpatient Substance Abuse Program  [phone]   Psychological Services Organization         Address  Phone  Notes  [facility] Behavioral Health  [phone]   [facility] Services  [phone]   [facility] Mental Health [address] [place] [phone] or [phone]    Mobile Crisis Teams Organization         Address  Phone  Notes  [facility], Mobile Crisis Care  Unit  [phone]   [facility]  [address] [place], [place] [phone]   Doristine Locks [address] [place] [phone]    Self-Help/Support Groups Organization         Address  Phone             Notes  [facility] [place] - variety of support groups  336- I7437963 Call for more information  Narcotics Anonymous (NA), [facility] [address], [employer] [place]  2 meetings at this location   Statistician         Address  Phone  Notes  [facility] [address],    [place]  [phone]   [facility]  [address], [place] [address], [place], [place] [phone]   [facility] [address] [place], [place] [phone] Admissions: 8am-3pm M-F  [facility] [address],    [place], [place] [phone]   [facility] [address] Starling Manns [place], [place] [phone]   [facility] [address],  [place], [place] [phone]   [facility] - Intensive Outpatient [address] Laurell Josephs [address], [place], [place] [phone]   [facility] ([facility]  [address]  [place], [place] [phone] or [phone]   [facility] ([facility]) [address] [place], [place] [phone] Accepts Medicaid  [facility] [address]  [place] [phone] Substance Abuse/Addiction Treatment   [facility] Behavioral Health Resources Organization         Address  Phone  Notes  [facility]  [phone](888) [phone]   Angie FavaJulie Brannon, PhD [address] [place], [place]   [phone]) [phone] or [phone]) [phone]   [facility]   [address] [place], [place] [phone]) [phone]   [facility] [address], [place], [place] [phone]) [phone] Insurance/Medicaid/sponsorship through [facility]  [facility] [address]                                     [place], [place] [phone](336) [phone] Therapy/tele-psych/case  [facility] [address]   [place], [place] [phone](336) [phone]    Dr. Lolly MustacheArfeen  [phone]   [facility] [place]  [employer] [facility] 1) [address], [place] 2) [address], [place] 3)  [address], [place] [phone]) [phone]) [phone]  [phone](336) [phone]   [facility] Child Abuse Hotline [phone](336) 4040937804 or [phone](336) 5142699124 (After Hours)

## 2014-01-26 NOTE — ED Provider Notes (Signed)
Medical screening examination/treatment/procedure(s) were performed by non-physician practitioner and as supervising physician I was immediately available for consultation/collaboration.   EKG Interpretation None       Raeford RazorStephen Marquan Vokes, MD 01/26/14 [phone]

## 2014-04-14 ENCOUNTER — Encounter: Payer: Self-pay | Admitting: Family Medicine

## 2014-04-14 ENCOUNTER — Ambulatory Visit (INDEPENDENT_AMBULATORY_CARE_PROVIDER_SITE_OTHER): Payer: Medicare Other | Admitting: *Deleted

## 2014-04-14 ENCOUNTER — Encounter: Payer: Self-pay | Admitting: *Deleted

## 2014-04-14 ENCOUNTER — Ambulatory Visit (INDEPENDENT_AMBULATORY_CARE_PROVIDER_SITE_OTHER): Payer: Medicare Other | Admitting: Family Medicine

## 2014-04-14 VITALS — BP 125/92 | HR 90 | Temp 97.7°F | Ht 67.0 in | Wt 205.0 lb

## 2014-04-14 DIAGNOSIS — E162 Hypoglycemia, unspecified: Secondary | ICD-10-CM | POA: Diagnosis not present

## 2014-04-14 DIAGNOSIS — Z23 Encounter for immunization: Secondary | ICD-10-CM

## 2014-04-14 DIAGNOSIS — S069X9A Unspecified intracranial injury with loss of consciousness of unspecified duration, initial encounter: Secondary | ICD-10-CM | POA: Insufficient documentation

## 2014-04-14 DIAGNOSIS — S069XAA Unspecified intracranial injury with loss of consciousness status unknown, initial encounter: Secondary | ICD-10-CM | POA: Insufficient documentation

## 2014-04-14 DIAGNOSIS — S069X5S Unspecified intracranial injury with loss of consciousness greater than 24 hours with return to pre-existing conscious level, sequela: Secondary | ICD-10-CM | POA: Diagnosis not present

## 2014-04-14 LAB — GLUCOSE, CAPILLARY: GLUCOSE-CAPILLARY: 85 mg/dL (ref 70–99)

## 2014-04-14 MED ORDER — LEVETIRACETAM 500 MG PO TABS: 500.0000 mg | ORAL_TABLET | Freq: Two times a day (BID) | ORAL | Status: DC

## 2014-04-14 MED ORDER — DONEPEZIL HCL 10 MG PO TABS: 10.0000 mg | ORAL_TABLET | Freq: Every day | ORAL | Status: DC

## 2014-04-14 NOTE — Assessment & Plan Note (Signed)
Unclear etiology in healthy young patient not on insulin. Can consider insulinoma vs autoimmune disease. 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 No long periods of fasting or strenuous exercise. Will obtain POCT glucose, serum insulin and c-peptide and refer to endocrinology for further work up.

## 2014-04-14 NOTE — Assessment & Plan Note (Signed)
Will refill aricept and keppra. Discussed with patient that he may be able to come off keppra since he has not had a seizure in the past 2 months seince being off. Patient would rather continue with therapy for now. Will restart at 500mg  bid and uptitrate to 1000mg  in 2 weeks.

## 2014-04-14 NOTE — Patient Instructions (Addendum)
Thank you for coming to the clinic today. It was nice seeing you.  For your memory, we have refilled your aricept today.  For your low blood sugars, we will draw some labs today and refer you to our endocrinologist.   For your seizures, we will restart your keppra 500mg  twice daily. In 2 weeks, increased to 1000mg  twice daily.   See you again in 1 month.
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
## 2014-04-15 LAB — INSULIN, RANDOM: Insulin: 38.5 u[IU]/mL — ABNORMAL HIGH (ref 2.0–19.6)

## 2014-04-15 LAB — C-PEPTIDE: C-Peptide: 7.18 ng/mL — ABNORMAL HIGH (ref 0.80–3.90)

## 2014-04-17 ENCOUNTER — Telehealth: Payer: Self-pay | Admitting: Family Medicine

## 2014-04-17 NOTE — Telephone Encounter (Signed)
Attempted to call patient RE: test results (elevated random insulin and c-peptide levels). No answer, left VM and callback number. Referral made to endocrinology at our last appointment.   Katina Degreealeb M. Jimmey RalphParker, MD [facility] Resident PGY-1 04/17/2014 3:00 PM

## 2014-04-18 ENCOUNTER — Encounter: Payer: Self-pay | Admitting: Family Medicine

## 2014-04-18 ENCOUNTER — Ambulatory Visit (INDEPENDENT_AMBULATORY_CARE_PROVIDER_SITE_OTHER): Payer: Medicare Other | Admitting: Family Medicine

## 2014-04-18 VITALS — BP 154/96 | HR 79 | Temp 98.6°F | Ht 67.0 in | Wt 200.9 lb

## 2014-04-18 DIAGNOSIS — J0110 Acute frontal sinusitis, unspecified: Secondary | ICD-10-CM

## 2014-04-18 DIAGNOSIS — J019 Acute sinusitis, unspecified: Secondary | ICD-10-CM | POA: Insufficient documentation

## 2014-04-18 DIAGNOSIS — M79671 Pain in right foot: Secondary | ICD-10-CM | POA: Diagnosis not present

## 2014-04-18 MED ORDER — AZITHROMYCIN 250 MG PO TABS: ORAL_TABLET | ORAL | Status: DC

## 2014-04-18 MED ORDER — NAPROXEN 500 MG PO TABS: 500.0000 mg | ORAL_TABLET | Freq: Two times a day (BID) | ORAL | Status: DC

## 2014-04-18 NOTE — Assessment & Plan Note (Signed)
Rhinorrhea, subjective fevers, and facial pain on exam Treat as acute sinusitis with azithromycin Discussed Afrin for rhinorrhea, with a limit of 2 days on Likely underlying viral infection which I discussed with him Follow-up as needed

## 2014-04-18 NOTE — Assessment & Plan Note (Signed)
Arch pain for 3-4 weeks most likely plantar fasciitis However no tenderness at the insertion of the plantar fascia Discussed NSAIDs, stretches, smoldering character of plantar fasciitis Given uncertainty with diagnosis and severe pain will go ahead and ask him to follow-up with sports medicine [facility] written today

## 2014-04-18 NOTE — Telephone Encounter (Signed)
LMOVM for pt to return call .Trystin Terhune Dawn  

## 2014-04-18 NOTE — Patient Instructions (Signed)
It looks like he had a virus infection, start azithromycin for this  He can also try Afrin for no more than 2 days straight, as well as over-the-counter decongestants  For your foot pain, try the ice 2-3 times per night as I described, also try the stretches that I provided for you  You should receive a call from sports medicine for an appointment.

## 2014-04-18 NOTE — Progress Notes (Signed)
Patient ID: George MinorsJoseph Harvey Weisinger, male   DOB: 07/13/1985, 29 y.o.   MRN: [id number]   HPI  Patient presents today for congestion of the pain  Congestion Patient states that starting 2-3 days ago he has had severe rhinorrhea, subjective fevers, headache, and very mild cough. He states that his cough is not persistent he denies dyspnea. He states he's taking food and oral fluids easily, his appetite is declining today. Has several sick contacts at home and at work with similar symptoms. Some of the living treated for pneumonia with azithromycin.  Foot pain Described as right sided intermittent sharp mid arch foot pain worsened the morning and with very specific positions. States that it started about 3-4 weeks ago and has continued intermittently since that time. At this moment it's not bothering him, at times it's a 9 out of 10 pain. He's tried over-the-counter medicines which have not helped. He has not tried any ice, heat, or stretches.  Smoking status noted ROS: Per HPI  Objective: BP 154/96 mmHg  Pulse 79  Temp(Src) 98.6 F (37 C) (Oral)  Ht 5\' 7"  (1.702 m)  Wt 200 lb 14.4 oz (91.128 kg)  BMI 31.46 kg/m2 Gen: NAD, alert, cooperative with exam HEENT: NCAT, left turbinate swollen, oropharynx clear, TMs normal bilaterally CV: RRR, good S1/S2, no murmur Resp: CTABL, no wheezes, non-labored Ext: Right foot with no tenderness to palpation of the arch, and specifically no tenderness to palpation at insertion of plantar fascia, no bony tenderness or external lesions. Grossly normal appearance. Neuro: Alert and oriented, No gross deficits  Assessment and plan:  Arch pain of right foot Arch pain for 3-4 weeks most likely plantar fasciitis However no tenderness at the insertion of the plantar fascia Discussed NSAIDs, stretches, smoldering character of plantar fasciitis Given uncertainty with diagnosis and severe pain will go ahead and ask him to follow-up with sports medicine [facility] written today  Acute sinusitis Rhinorrhea, subjective fevers, and facial pain on exam Treat as acute sinusitis with azithromycin Discussed Afrin for rhinorrhea, with a limit of 2 days on Likely underlying viral infection which I discussed with him Follow-up as needed   Orders Placed This Encounter  Procedures  . Ambulatory referral to Sports Medicine    Referral Priority:  Routine    Referral Type:  Consultation    Number of Visits Requested:  1    Meds ordered this encounter  Medications  . azithromycin (ZITHROMAX) 250 MG tablet    Sig: Take 2 pills on day 1 then 1 pill daily until finished    Dispense:  6 tablet    Refill:  0  . naproxen (NAPROSYN) 500 MG tablet    Sig: Take 1 tablet (500 mg total) by mouth 2 (two) times daily with a meal.    Dispense:  30 tablet    Refill:  0

## 2014-05-02 ENCOUNTER — Ambulatory Visit: Payer: Medicare Other | Admitting: Sports Medicine

## 2014-05-12 ENCOUNTER — Ambulatory Visit: Payer: Medicare Other | Admitting: Endocrinology

## 2014-05-12 ENCOUNTER — Ambulatory Visit (INDEPENDENT_AMBULATORY_CARE_PROVIDER_SITE_OTHER): Payer: Medicare Other | Admitting: Family Medicine

## 2014-05-12 ENCOUNTER — Encounter: Payer: Self-pay | Admitting: Family Medicine

## 2014-05-12 VITALS — BP 128/92 | HR 65 | Temp 98.3°F | Ht 67.0 in | Wt 204.0 lb

## 2014-05-12 DIAGNOSIS — E162 Hypoglycemia, unspecified: Secondary | ICD-10-CM

## 2014-05-12 NOTE — Progress Notes (Signed)
   George MinorsJoseph Harvey Slivinski is a 29 y.o. male who [facility]. His concerns today include:  HPI: Hypoglycemia. Symptoms have been controlled fairly well per patient. Still endorses some symptoms of lightheadedness. No seizures. Tries to eat regularly throughout the day. No diaphoresis or palpitations. No fevers or chills. Has not seen endocrinologist yet.   ROS: As per HPI.   Past Medical History - Reviewed and updated Patient Active Problem List   Diagnosis Date Noted  . Arch pain of right foot 04/18/2014  . Acute sinusitis 04/18/2014  . TBI (traumatic brain injury) 04/14/2014  . Hypoglycemia 04/14/2014   Objective: Physical Exam: BP 128/92 mmHg  Pulse 65  Temp(Src) 98.3 F (36.8 C) (Other (Comment))  Ht 5\' 7"  (1.702 m)  Wt 204 lb (92.534 kg)  BMI 31.94 kg/m2  Gen: NAD, resting comfortably CV: RRR with no murmurs appreciated Lungs: NWOB, CTAB with no crackles, wheezes, or rhonchi Abdomen: Normal bowel sounds present. Soft, Nontender, Nondistended. Ext: no edema Skin: warm, dry Neuro: grossly normal, moves all extremities  A/P: See problem list  Hypoglycemia Symptoms are being fairly well controlled. 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 Patient missed appointment with Endocrinology today. Sent in another referral and asked him to reschedule. Encouraged patient to continue eating regularly. Will follow up after endocrinologist visit.      Orders Placed This Encounter  Procedures  . Ambulatory referral to Endocrinology    Referral Priority:  Routine    Referral Type:  Consultation    Referral Reason:  Specialty Services Required    Number of Visits Requested:  1    Caleb M. Jimmey RalphParker, MD [facility] Resident PGY-1 05/12/2014 6:51 PM

## 2014-05-12 NOTE — Assessment & Plan Note (Addendum)
Symptoms are being fairly well controlled. 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 Patient missed appointment with Endocrinology today. Sent in another referral and asked him to reschedule. Encouraged patient to continue eating regularly. Will follow up after endocrinologist visit.

## 2014-05-12 NOTE — Patient Instructions (Signed)
Thank you for coming to the clinic today. It was nice seeing you.  For your low blood sugar, it sound like you are managing it well. You do have high levels of insulin in your body, which could explain why your sugars are getting to low. Please see the endocrinologist  For further work up.  See you again in 3-6 months.

## 2014-05-15 ENCOUNTER — Ambulatory Visit (INDEPENDENT_AMBULATORY_CARE_PROVIDER_SITE_OTHER): Payer: Medicare Other | Admitting: Sports Medicine

## 2014-05-15 ENCOUNTER — Encounter: Payer: Self-pay | Admitting: Sports Medicine

## 2014-05-15 VITALS — BP 114/83 | Ht 67.0 in | Wt 204.0 lb

## 2014-05-15 DIAGNOSIS — M25571 Pain in right ankle and joints of right foot: Secondary | ICD-10-CM | POA: Diagnosis present

## 2014-05-15 NOTE — Progress Notes (Signed)
   Subjective:    Patient ID: George Ingram, male    DOB: 04/23/1985, 29 y.o.   MRN: [id number]  HPI chief complaint: Right foot pain  Very pleasant 29 year old male comes in today complaining of 2 months of right foot pain. No known trauma. His job requires him to stand for long periods of time. He describes an aching discomfort primarily along the arch of his foot which at times were radiate into his medial ankle. It is worse with certain motions such as twisting or turning on his forefoot which he does repetitively as part of his job. He was seen at the family practice center and placed on an anti-inflammatory and referred to us for further evaluation. He has a pair of soft arch supports which he purchased shortly after his foot started hurting. He denies any pain at the heel. No problems with his foot in the past. No numbness and tingling. He is here today with his fiance. Past medical history reviewed Medications reviewed Allergies reviewed    Review of Systems     Objective:   Physical Exam Well-developed, well-nourished. No acute distress. Awake alert and oriented 3.  Right foot: There is no tenderness to palpation at the calcaneal insertion the plantar fascia. Patient is tender to palpation along the middle portion of his plantar fascia but is also tender to palpation along the insertion of the posterior tibialis tendon. Patient demonstrates pes planus with standing and reproducible pain with resisted foot inversion. Reproducible pain with standing on his tiptoes. No soft tissue swelling. Negative Tinel's at the tarsal tunnel. Neurovascular intact distally.  MSK ultrasound of the right foot was done. This was a limited scan. Plantar fascia was well visualized and appears to be within normal limits without any evidence of tearing or thickening. There is some fluid surrounding the insertion of the posterior tibialis tendon onto the navicular. His is best seen in short view. Findings  consistent with insertional posterior tibialis tendinitis.      Assessment & Plan:  Right foot pain likely secondary to posterior tibialis tendinitis Pes planus  Full body helix ankle compression sleeve to wear at work. Green sports insoles with scaphoid pads to wear in his work shoes. Nightly ice immersion for 10 minutes at a time. Follow-up with me in 4 weeks for reevaluation. If he finds the temporary inserts to be comfortable then we will plan on custom orthotics at his follow-up visit.

## 2014-05-16 ENCOUNTER — Other Ambulatory Visit: Payer: Self-pay | Admitting: *Deleted

## 2014-05-16 DIAGNOSIS — M79671 Pain in right foot: Secondary | ICD-10-CM

## 2014-05-17 MED ORDER — NAPROXEN 500 MG PO TABS: 500.0000 mg | ORAL_TABLET | Freq: Two times a day (BID) | ORAL | Status: DC

## 2014-06-19 ENCOUNTER — Encounter: Payer: Self-pay | Admitting: Sports Medicine

## 2014-06-19 ENCOUNTER — Ambulatory Visit (INDEPENDENT_AMBULATORY_CARE_PROVIDER_SITE_OTHER): Payer: Medicare Other | Admitting: Sports Medicine

## 2014-06-19 VITALS — BP 117/77 | HR 80 | Ht 67.0 in | Wt 204.0 lb

## 2014-06-19 DIAGNOSIS — M25571 Pain in right ankle and joints of right foot: Secondary | ICD-10-CM

## 2014-06-19 NOTE — Progress Notes (Signed)
   Subjective:    Patient ID: George MinorsJoseph Harvey Ingram, male    DOB: 12/25/1985, 29 y.o.   MRN: [id number]  HPI   Patient comes in today for follow-up on right ankle posterior tibialis tendinitis. He has noticed some improvement with the body helix compression sleeve. He is also wearing his green sports insoles with scaphoid pads. He has not been icing. He was previously limping but that has now resolved. He is here today with his fiance.    Review of Systems     Objective:   Physical Exam Well-developed, well-nourished  Right ankle: Full range of motion. No effusion. No soft tissue swelling. There is some tenderness to palpation along the distal posterior tibialis tendon at the insertion onto the navicular. Walking without a limp. Neurovascularly intact distally.       Assessment & Plan:  Improving posterior tibialis tendinitis, right ankle  Continue with body helix compression sleeve. We discussed the possibility of custom orthotics but the patient is concerned that they may be a little too thick although the shoes that he has with him today will certainly accommodate those. Therefore, he will simply stick with his green sports insoles and scaphoid pads and he will return to the office in 4 weeks for reevaluation.

## 2014-07-17 ENCOUNTER — Ambulatory Visit: Payer: Medicare Other | Admitting: Sports Medicine

## 2014-08-07 ENCOUNTER — Ambulatory Visit: Payer: Medicare Other | Admitting: Sports Medicine

## 2014-10-16 ENCOUNTER — Other Ambulatory Visit: Payer: Self-pay | Admitting: *Deleted

## 2014-10-16 DIAGNOSIS — S069X5S Unspecified intracranial injury with loss of consciousness greater than 24 hours with return to pre-existing conscious level, sequela: Secondary | ICD-10-CM

## 2014-10-16 MED ORDER — DONEPEZIL HCL 10 MG PO TABS: 10.0000 mg | ORAL_TABLET | Freq: Every day | ORAL | Status: DC

## 2014-10-31 ENCOUNTER — Other Ambulatory Visit: Payer: Self-pay | Admitting: *Deleted

## 2014-10-31 DIAGNOSIS — S069X5S Unspecified intracranial injury with loss of consciousness greater than 24 hours with return to pre-existing conscious level, sequela: Secondary | ICD-10-CM

## 2014-10-31 MED ORDER — LEVETIRACETAM 500 MG PO TABS: 1000.0000 mg | ORAL_TABLET | Freq: Two times a day (BID) | ORAL | Status: DC

## 2014-12-12 ENCOUNTER — Emergency Department (HOSPITAL_COMMUNITY)
Admission: EM | Admit: 2014-12-12 | Discharge: 2014-12-12 | Disposition: A | Discharge status: Home / Self Care | Payer: Medicare Other | Attending: Emergency Medicine | Admitting: Emergency Medicine

## 2014-12-12 ENCOUNTER — Encounter (HOSPITAL_COMMUNITY): Payer: Self-pay | Admitting: Emergency Medicine

## 2014-12-12 DIAGNOSIS — Z87820 Personal history of traumatic brain injury: Secondary | ICD-10-CM | POA: Insufficient documentation

## 2014-12-12 DIAGNOSIS — J45909 Unspecified asthma, uncomplicated: Secondary | ICD-10-CM | POA: Insufficient documentation

## 2014-12-12 DIAGNOSIS — G40909 Epilepsy, unspecified, not intractable, without status epilepticus: Secondary | ICD-10-CM | POA: Diagnosis not present

## 2014-12-12 DIAGNOSIS — G8929 Other chronic pain: Secondary | ICD-10-CM | POA: Diagnosis not present

## 2014-12-12 DIAGNOSIS — Z791 Long term (current) use of non-steroidal anti-inflammatories (NSAID): Secondary | ICD-10-CM | POA: Diagnosis not present

## 2014-12-12 DIAGNOSIS — Z79899 Other long term (current) drug therapy: Secondary | ICD-10-CM | POA: Insufficient documentation

## 2014-12-12 DIAGNOSIS — J029 Acute pharyngitis, unspecified: Secondary | ICD-10-CM | POA: Diagnosis present

## 2014-12-12 DIAGNOSIS — J4520 Mild intermittent asthma, uncomplicated: Secondary | ICD-10-CM

## 2014-12-12 DIAGNOSIS — Z87891 Personal history of nicotine dependence: Secondary | ICD-10-CM | POA: Diagnosis not present

## 2014-12-12 DIAGNOSIS — J069 Acute upper respiratory infection, unspecified: Secondary | ICD-10-CM | POA: Diagnosis not present

## 2014-12-12 LAB — RAPID STREP SCREEN (MED CTR MEBANE ONLY): STREPTOCOCCUS, GROUP A SCREEN (DIRECT): NEGATIVE

## 2014-12-12 MED ORDER — ALBUTEROL SULFATE HFA 108 (90 BASE) MCG/ACT IN AERS
2.0000 | INHALATION_SPRAY | Freq: Once | RESPIRATORY_TRACT | Status: AC
  Administered 2014-12-12: 2 via RESPIRATORY_TRACT
  Filled 2014-12-12: qty 6.7

## 2014-12-12 MED ORDER — AZITHROMYCIN 250 MG PO TABS: ORAL_TABLET | ORAL | Status: DC

## 2014-12-12 MED ORDER — PREDNISONE 20 MG PO TABS: 40.0000 mg | ORAL_TABLET | Freq: Every day | ORAL | Status: DC

## 2014-12-12 NOTE — Discharge Instructions (Signed)
Bronchospasm °A bronchospasm is a spasm or tightening of the airways going into the lungs. During a bronchospasm breathing becomes more difficult because the airways get smaller. When this happens there can be coughing, a whistling sound when breathing (wheezing), and difficulty breathing. Bronchospasm is often associated with asthma, but not all patients who experience a bronchospasm have asthma. °CAUSES  °A bronchospasm is caused by inflammation or irritation of the airways. The inflammation or irritation may be triggered by:  °· Allergies (such as to animals, pollen, food, or mold). Allergens that cause bronchospasm may cause wheezing immediately after exposure or many hours later.   °· Infection. Viral infections are believed to be the most common cause of bronchospasm.   °· Exercise.   °· Irritants (such as pollution, cigarette smoke, strong odors, aerosol sprays, and paint fumes).   °· Weather changes. Winds increase molds and pollens in the air. Rain refreshes the air by washing irritants out. Cold air may cause inflammation.   °· Stress and emotional upset.   °SIGNS AND SYMPTOMS  °· Wheezing.   °· Excessive nighttime coughing.   °· Frequent or severe coughing with a simple cold.   °· Chest tightness.   °· Shortness of breath.   °DIAGNOSIS  °Bronchospasm is usually diagnosed through a history and physical exam. Tests, such as chest X-rays, are sometimes done to look for other conditions. °TREATMENT  °· Inhaled medicines can be given to open up your airways and help you breathe. The medicines can be given using either an inhaler or a nebulizer machine. °· Corticosteroid medicines may be given for severe bronchospasm, usually when it is associated with asthma. °HOME CARE INSTRUCTIONS  °· Always have a plan prepared for seeking medical care. Know when to call your health care provider and local emergency services (911 in the [place] Know where you can access local emergency care. °· Only take medicines as  directed by your health care provider. °· If you were prescribed an inhaler or nebulizer machine, ask your health care provider to explain how to use it correctly. Always use a spacer with your inhaler if you were given one. °· It is necessary to remain calm during an attack. Try to relax and breathe more slowly.  °· Control your home environment in the following ways:   °· Change your heating and air conditioning filter at least once a month.   °· Limit your use of fireplaces and wood stoves. °· Do not smoke and do not allow smoking in your home.   °· Avoid exposure to perfumes and fragrances.   °· Get rid of pests (such as roaches and mice) and their droppings.   °· Throw away plants if you see mold on them.   °· Keep your house clean and dust free.   °· Replace carpet with wood, tile, or vinyl flooring. Carpet can trap dander and dust.   °· Use allergy-proof pillows, mattress covers, and box spring covers.   °· Wash bed sheets and blankets every week in hot water and dry them in a dryer.   °· Use blankets that are made of polyester or cotton.   °· Wash hands frequently. °SEEK MEDICAL CARE IF:  °· You have muscle aches.   °· You have chest pain.   °· The sputum changes from clear or white to yellow, green, gray, or bloody.   °· The sputum you cough up gets thicker.   °· There are problems that may be related to the medicine you are given, such as a rash, itching, swelling, or trouble breathing.   °SEEK IMMEDIATE MEDICAL CARE IF:  °· You have worsening wheezing and coughing even   after taking your prescribed medicines.   °· You have increased difficulty breathing.   °· You develop severe chest pain. °MAKE SURE YOU:  °· Understand these instructions. °· Will watch your condition. °· Will get help right away if you are not doing well or get worse. °Document Released: 03/27/2003 Document Revised: 03/29/2013 Document Reviewed: 09/13/2012 °Exit[employer]® Patient Information ©2015 [employer]. This information is not  intended to replace advice given to you by your health care provider. Make sure you discuss any questions you have with your health care provider. ° °Upper Respiratory Infection, Adult °An upper respiratory infection (URI) is also sometimes known as the common cold. The upper respiratory tract includes the nose, sinuses, throat, trachea, and bronchi. Bronchi are the airways leading to the lungs. Most people improve within 1 week, but symptoms can last up to 2 weeks. A residual cough may last even longer.  °CAUSES °Many different viruses can infect the tissues lining the upper respiratory tract. The tissues become irritated and inflamed and often become very moist. Mucus production is also common. A cold is contagious. You can easily spread the virus to others by oral contact. This includes kissing, sharing a glass, coughing, or sneezing. Touching your mouth or nose and then touching a surface, which is then touched by another person, can also spread the virus. °SYMPTOMS  °Symptoms typically develop 1 to 3 days after you come in contact with a cold virus. Symptoms vary from person to person. They may include: °· Runny nose. °· Sneezing. °· Nasal congestion. °· Sinus irritation. °· Sore throat. °· Loss of voice (laryngitis). °· Cough. °· Fatigue. °· Muscle aches. °· Loss of appetite. °· Headache. °· Low-grade fever. °DIAGNOSIS  °You might diagnose your own cold based on familiar symptoms, since most people get a cold 2 to 3 times a year. Your caregiver can confirm this based on your exam. Most importantly, your caregiver can check that your symptoms are not due to another disease such as strep throat, sinusitis, pneumonia, asthma, or epiglottitis. Blood tests, throat tests, and X-rays are not necessary to diagnose a common cold, but they may sometimes be helpful in excluding other more serious diseases. Your caregiver will decide if any further tests are required. °RISKS AND COMPLICATIONS  °You may be at risk for a  more severe case of the common cold if you smoke cigarettes, have chronic heart disease (such as heart failure) or lung disease (such as asthma), or if you have a weakened immune system. The very young and very old are also at risk for more serious infections. Bacterial sinusitis, middle ear infections, and bacterial pneumonia can complicate the common cold. The common cold can worsen asthma and chronic obstructive pulmonary disease (COPD). Sometimes, these complications can require emergency medical care and may be life-threatening. °PREVENTION  °The best way to protect against getting a cold is to practice good hygiene. Avoid oral or hand contact with people with cold symptoms. Wash your hands often if contact occurs. There is no clear evidence that vitamin C, vitamin E, echinacea, or exercise reduces the chance of developing a cold. However, it is always recommended to get plenty of rest and practice good nutrition. °TREATMENT  °Treatment is directed at relieving symptoms. There is no cure. Antibiotics are not effective, because the infection is caused by a virus, not by bacteria. Treatment may include: °· Increased fluid intake. Sports drinks offer valuable electrolytes, sugars, and fluids. °· Breathing heated mist or steam (vaporizer or shower). °· Eating chicken soup   or other clear broths, and maintaining good nutrition. °· Getting plenty of rest. °· Using gargles or lozenges for comfort. °· Controlling fevers with ibuprofen or acetaminophen as directed by your caregiver. °· Increasing usage of your inhaler if you have asthma. °Zinc gel and zinc lozenges, taken in the first 24 hours of the common cold, can shorten the duration and lessen the severity of symptoms. Pain medicines may help with fever, muscle aches, and throat pain. A variety of non-prescription medicines are available to treat congestion and runny nose. Your caregiver can make recommendations and may suggest nasal or lung inhalers for other  symptoms.  °HOME CARE INSTRUCTIONS  °· Only take over-the-counter or prescription medicines for pain, discomfort, or fever as directed by your caregiver. °· Use a warm mist humidifier or inhale steam from a shower to increase air moisture. This may keep secretions moist and make it easier to breathe. °· Drink enough water and fluids to keep your urine clear or pale yellow. °· Rest as needed. °· Return to work when your temperature has returned to normal or as your caregiver advises. You may need to stay home longer to avoid infecting others. You can also use a face mask and careful hand washing to prevent spread of the virus. °SEEK MEDICAL CARE IF:  °· After the first few days, you feel you are getting worse rather than better. °· You need your caregiver's advice about medicines to control symptoms. °· You develop chills, worsening shortness of breath, or brown or red sputum. These may be signs of pneumonia. °· You develop yellow or brown nasal discharge or pain in the face, especially when you bend forward. These may be signs of sinusitis. °· You develop a fever, swollen neck glands, pain with swallowing, or white areas in the back of your throat. These may be signs of strep throat. °SEEK IMMEDIATE MEDICAL CARE IF:  °· You have a fever. °· You develop severe or persistent headache, ear pain, sinus pain, or chest pain. °· You develop wheezing, a prolonged cough, cough up blood, or have a change in your usual mucus (if you have chronic lung disease). °· You develop sore muscles or a stiff neck. °Document Released: 09/17/2000 Document Revised: 06/16/2011 Document Reviewed: 06/29/2013 °ExitCare® Patient Information ©2015 [employer]. This information is not intended to replace advice given to you by your health care provider. Make sure you discuss any questions you have with your health care provider. ° °

## 2014-12-12 NOTE — ED Provider Notes (Signed)
CSN: [id number]     Arrival date & time 12/12/14  1317 History  This chart was scribed for non-physician practitioner, Arthor Captain, PA-C, working with Benjiman Core, MD by Freida Busman, ED Scribe. This patient was seen in room WTR7/WTR7 and the patient's care was started at 3:55 PM.  Chief Complaint  Patient presents with  . URI  . Sore Throat   The history is provided by the patient. No language interpreter was used.    HPI Comments:  George Ingram is a 29 y.o. male who presents to the Emergency Department complaining of cold symptoms for about 1 week. He reports sore throat, rhinorrhea and dry cough. He has taken multiple OTC medications without relief. His fiancee has similar symptoms. Pt denies fever. He denies smoking history but notes he lives with a smoker.   Past Medical History  Diagnosis Date  . TBI (traumatic brain injury) 2011  . Meningitis     at age 31  . Chronic back pain   . 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 Brain surgery  2011   Family History  Problem Relation Age of Onset  . Heart disease Brother    Social History  Substance Use Topics  . Smoking status: Former Smoker    Quit date: 04/14/2009  . Smokeless tobacco: None  . Alcohol Use: No    Review of Systems  Constitutional: Negative for fever and chills.  HENT: Positive for rhinorrhea and sore throat.   Respiratory: Positive for cough.     Allergies  Review of patient's allergies indicates no known allergies.  Home Medications   Prior to Admission medications   Medication Sig Start Date End Date Taking? Authorizing Provider  albuterol (PROVENTIL HFA;VENTOLIN HFA) 108 (90 BASE) MCG/ACT inhaler Inhale 1-2 puffs into the lungs every 6 (six) hours as needed for wheezing or shortness of breath.    Historical Provider, MD  azithromycin (ZITHROMAX Z-PAK) 250 MG tablet 2 po day one, then 1 daily x 4 days 12/12/14   Arthor Captain, PA-C  cyclobenzaprine  (FLEXERIL) 10 MG tablet Take 1 tablet (10 mg total) by mouth 2 (two) times daily as needed for muscle spasms. 01/23/14   Marissa Sciacca, PA-C  donepezil (ARICEPT) 10 MG tablet Take 1 tablet (10 mg total) by mouth daily. 10/16/14   Ardith Dark, MD  ibuprofen (ADVIL,MOTRIN) 400 MG tablet Take 1 tablet (400 mg total) by mouth every 8 (eight) hours as needed. 01/23/14   Marissa Sciacca, PA-C  levETIRAcetam (KEPPRA) 500 MG tablet Take 2 tablets (1,000 mg total) by mouth 2 (two) times daily. 10/31/14   Ardith Dark, MD  naproxen (NAPROSYN) 500 MG tablet Take 1 tablet (500 mg total) by mouth 2 (two) times daily with a meal. 05/17/14   Ardith Dark, MD  predniSONE (DELTASONE) 20 MG tablet Take 2 tablets (40 mg total) by mouth daily. 12/12/14   Donnis Phaneuf, PA-C   BP 118/77 mmHg  Pulse 64  Temp(Src) 97.4 F (36.3 C) (Oral)  SpO2 99% Physical Exam Appears moderately ill but not toxic; temperature as noted in vitals. Ears normal. Eyes:glassy appearance, no discharge  Heart: RRR, NO M/G/R Throat and pharynx normal.   Neck supple. No adenopathyhy in the neck.  Sinuses non tender.  The chest is clear. Abdomen is soft and nontender  ED Course  Procedures   DIAGNOSTIC STUDIES:  Oxygen Saturation is 98% on RA, normal by  my interpretation.    COORDINATION OF CARE:  3:58 PM Discussed treatment plan with pt at bedside and pt agreed to plan.  Labs Review Labs Reviewed  RAPID STREP SCREEN (NOT AT [facility])  CULTURE, GROUP A STREP    Imaging Review No results found. I have personally reviewed and evaluated these images and lab results as part of my medical decision-making.   EKG Interpretation None        MDM   Final diagnoses:  URI (upper respiratory infection)  RAD (reactive airway disease), mild intermittent, uncomplicated     Patients symptoms are consistent with URI, likely viral etiology. Discussed that antibiotics are not indicated for viral infections. Pt will be  discharged with symptomatic treatment.  Verbalizes understanding and is agreeable with plan. Pt is hemodynamically stable & in NAD prior to dc.   I personally performed the services described in this documentation, which was scribed in my presence. The recorded information has been reviewed and is accurate.      Arthor Captain, PA-C 12/15/14 1929  Benjiman Core, MD 12/20/14 [phone]

## 2014-12-12 NOTE — ED Notes (Signed)
Per pt, states cold symptoms for about a week, congestion and sore throat

## 2014-12-14 LAB — CULTURE, GROUP A STREP: Strep A Culture: NEGATIVE

## 2015-02-17 ENCOUNTER — Emergency Department (HOSPITAL_COMMUNITY)
Admission: EM | Admit: 2015-02-17 | Discharge: 2015-02-17 | Disposition: A | Discharge status: Home / Self Care | Payer: Medicare Other | Attending: Emergency Medicine | Admitting: Emergency Medicine

## 2015-02-17 ENCOUNTER — Encounter (HOSPITAL_COMMUNITY): Payer: Self-pay

## 2015-02-17 DIAGNOSIS — Z8661 Personal history of infections of the central nervous system: Secondary | ICD-10-CM | POA: Insufficient documentation

## 2015-02-17 DIAGNOSIS — Z7952 Long term (current) use of systemic steroids: Secondary | ICD-10-CM | POA: Insufficient documentation

## 2015-02-17 DIAGNOSIS — Y998 Other external cause status: Secondary | ICD-10-CM | POA: Diagnosis not present

## 2015-02-17 DIAGNOSIS — G8929 Other chronic pain: Secondary | ICD-10-CM | POA: Insufficient documentation

## 2015-02-17 DIAGNOSIS — Y9389 Activity, other specified: Secondary | ICD-10-CM | POA: Diagnosis not present

## 2015-02-17 DIAGNOSIS — S61212A Laceration without foreign body of right middle finger without damage to nail, initial encounter: Secondary | ICD-10-CM | POA: Diagnosis not present

## 2015-02-17 DIAGNOSIS — W260XXA Contact with knife, initial encounter: Secondary | ICD-10-CM | POA: Insufficient documentation

## 2015-02-17 DIAGNOSIS — Z791 Long term (current) use of non-steroidal anti-inflammatories (NSAID): Secondary | ICD-10-CM | POA: Insufficient documentation

## 2015-02-17 DIAGNOSIS — Z87820 Personal history of traumatic brain injury: Secondary | ICD-10-CM | POA: Diagnosis not present

## 2015-02-17 DIAGNOSIS — Y9289 Other specified places as the place of occurrence of the external cause: Secondary | ICD-10-CM | POA: Insufficient documentation

## 2015-02-17 DIAGNOSIS — Z79899 Other long term (current) drug therapy: Secondary | ICD-10-CM | POA: Diagnosis not present

## 2015-02-17 DIAGNOSIS — G40909 Epilepsy, unspecified, not intractable, without status epilepticus: Secondary | ICD-10-CM | POA: Diagnosis not present

## 2015-02-17 DIAGNOSIS — Z792 Long term (current) use of antibiotics: Secondary | ICD-10-CM | POA: Diagnosis not present

## 2015-02-17 DIAGNOSIS — Z87891 Personal history of nicotine dependence: Secondary | ICD-10-CM | POA: Diagnosis not present

## 2015-02-17 MED ORDER — BACITRACIN ZINC 500 UNIT/GM EX OINT: 1.0000 "application " | TOPICAL_OINTMENT | Freq: Two times a day (BID) | CUTANEOUS | Status: DC

## 2015-02-17 MED ORDER — HYDROCODONE-ACETAMINOPHEN 5-325 MG PO TABS: 2.0000 | ORAL_TABLET | ORAL | Status: AC | PRN

## 2015-02-17 MED ORDER — IBUPROFEN 800 MG PO TABS: 800.0000 mg | ORAL_TABLET | Freq: Once | ORAL | Status: DC

## 2015-02-17 MED ORDER — LIDOCAINE HCL 2 % IJ SOLN: 10.0000 mL | Freq: Once | INTRAMUSCULAR | Status: DC

## 2015-02-17 MED ORDER — LIDOCAINE-EPINEPHRINE 1 %-1:100000 IJ SOLN
10.0000 mL | Freq: Once | INTRAMUSCULAR | Status: AC
  Administered 2015-02-17: 10 mL
  Filled 2015-02-17: qty 1

## 2015-02-17 MED ORDER — IBUPROFEN 800 MG PO TABS: 800.0000 mg | ORAL_TABLET | Freq: Three times a day (TID) | ORAL | Status: AC

## 2015-02-17 NOTE — ED Provider Notes (Signed)
CSN: [id number]     Arrival date & time 02/17/15  1654 History  By signing my name below, I, George Ingram, attest that this documentation has been prepared under the direction and in the presence of Danelle Berry, PA-C. Electronically Signed: Phillis Ingram, ED Scribe. 02/17/2015. 8:05 PM.  Chief Complaint  Patient presents with  . Finger Injury   The history is provided by the patient. No language interpreter was used.  HPI Comments: George Ingram is a 29 y.o. male who presents to the Emergency Department complaining of a laceration to the right middle finger onset PTA. Pt reports cutting his finger with a "clean pocket knife." Bleeding is currently controlled and a light sterile pressure dressing was placed at triage. He denies color change, numbness or weakness to the area. Pt states that he is UTD on his tdap.   Past Medical History  Diagnosis Date  . TBI (traumatic brain injury) ([facility]) 2011  . Meningitis     at age 30  . Chronic back pain   . 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 Brain surgery  2011   Family History  Problem Relation Age of Onset  . Heart disease Brother    Social History  Substance Use Topics  . Smoking status: Former Smoker    Quit date: 04/14/2009  . Smokeless tobacco: None  . Alcohol Use: No    Review of Systems  Skin: Positive for wound. Negative for color change.  Neurological: Negative for weakness and numbness.  All other systems reviewed and are negative.  Allergies  Review of patient's allergies indicates no known allergies.  Home Medications   Prior to Admission medications   Medication Sig Start Date End Date Taking? Authorizing Provider  albuterol (PROVENTIL HFA;VENTOLIN HFA) 108 (90 BASE) MCG/ACT inhaler Inhale 1-2 puffs into the lungs every 6 (six) hours as needed for wheezing or shortness of breath.    Historical Provider, MD  azithromycin (ZITHROMAX Z-PAK) 250 MG tablet 2 po day  one, then 1 daily x 4 days 12/12/14   Arthor Captain, PA-C  cyclobenzaprine (FLEXERIL) 10 MG tablet Take 1 tablet (10 mg total) by mouth 2 (two) times daily as needed for muscle spasms. 01/23/14   Marissa Sciacca, PA-C  donepezil (ARICEPT) 10 MG tablet Take 1 tablet (10 mg total) by mouth daily. 10/16/14   Ardith Dark, MD  HYDROcodone-acetaminophen (NORCO/VICODIN) 5-325 MG tablet Take 2 tablets by mouth every 4 (four) hours as needed. 02/17/15   Danelle Berry, PA-C  ibuprofen (ADVIL,MOTRIN) 800 MG tablet Take 1 tablet (800 mg total) by mouth 3 (three) times daily. 02/17/15   Danelle Berry, PA-C  levETIRAcetam (KEPPRA) 500 MG tablet Take 2 tablets (1,000 mg total) by mouth 2 (two) times daily. 10/31/14   Ardith Dark, MD  naproxen (NAPROSYN) 500 MG tablet Take 1 tablet (500 mg total) by mouth 2 (two) times daily with a meal. 05/17/14   Ardith Dark, MD  predniSONE (DELTASONE) 20 MG tablet Take 2 tablets (40 mg total) by mouth daily. 12/12/14   Abigail Harris, PA-C   BP 128/79 mmHg  Pulse 60  Temp(Src) 98.2 F (36.8 C) (Oral)  Resp 18  SpO2 100% Physical Exam  Constitutional: He is oriented to person, place, and time. He appears well-developed and well-nourished. No distress.  HENT:  Head: Normocephalic and atraumatic.  Right Ear: External ear normal.  Left Ear: External ear normal.  Nose: Nose normal.  Mouth/Throat: Oropharynx is clear and moist. No oropharyngeal exudate.  Eyes: Conjunctivae and EOM are normal. Pupils are equal, round, and reactive to light. Right eye exhibits no discharge. Left eye exhibits no discharge. No scleral icterus.  Neck: Normal range of motion. Neck supple. No JVD present. No tracheal deviation present.  Cardiovascular: Normal rate and regular rhythm.   Pulmonary/Chest: Effort normal and breath sounds normal. No stridor. No respiratory distress.  Musculoskeletal: Normal range of motion. He exhibits no edema.  Lymphadenopathy:    He has no cervical adenopathy.   Neurological: He is alert and oriented to person, place, and time. He exhibits normal muscle tone. Coordination normal.  Skin: Skin is warm and dry. No rash noted. He is not diaphoretic. No erythema. No pallor.  6 cm long linear laceration to inside edge of right middle finger extending along PIP to DIP Sub q exposed with slow bleeding controlled by holding pressure.  No visible tendon, bone or nerve.  Sensation intact.  Normal capillary refill  Psychiatric: He has a normal mood and affect. His behavior is normal. Judgment and thought content normal.    ED Course  Procedures (including critical care time) DIAGNOSTIC STUDIES: Oxygen Saturation is 100% on RA, normal by my interpretation.    COORDINATION OF CARE: 8:00 PM-Discussed treatment plan which includes laceration repair with pt at bedside and pt agreed to plan.   LACERATION REPAIR Performed by: Danelle BerryLeisa Amayiah Gosnell, PA-C Consent: Verbal consent obtained. Risks and benefits: risks, benefits and alternatives were discussed Patient identity confirmed: provided demographic data Time out performed prior to procedure Prepped and Draped in normal sterile fashion Wound explored Laceration Location: right middle finger Laceration Length: 6cm No Foreign Bodies seen or palpated Anesthesia: local infiltration Local anesthetic: lidocaine 1% With epinephrine Anesthetic total: 8 ml Irrigation method: syringe Amount of cleaning: standard Skin closure: 5.0 prolene, 4.0 prolene Number of sutures or staples: 8  Technique: simple interupted Patient tolerance: Patient tolerated the procedure well with no immediate complications.  Labs Review Labs Reviewed - No data to display  Imaging Review No results found. I have personally reviewed and evaluated these images and lab results as part of my medical decision-making.   EKG Interpretation None      MDM   Final diagnoses:  Laceration of right middle finger w/o foreign body w/o damage to  nail, initial encounter   Finger lac, superficial without tendon or joint involvement. Normal range of motion, normal strength and normal sensation, capillary refill intact. Laceration was repaired with digital block and a simple interrupted sutures.  Hemostasis achieved with suture repair.  Wound care reviewed with patient. Finger splint applied.  I personally performed the services described in this documentation, which was scribed in my presence. The recorded information has been reviewed and is accurate.      Danelle BerryLeisa Sekou Zuckerman, PA-C 02/20/15 [id number]  Melene Planan Floyd, DO 02/20/15 [phone]

## 2015-02-17 NOTE — Discharge Instructions (Signed)
Laceration Care, Adult °A laceration is a cut that goes through all layers of the skin. The cut also goes into the tissue that is right under the skin. Some cuts heal on their own. Others need to be closed with stitches (sutures), staples, skin adhesive strips, or wound glue. Taking care of your cut lowers your risk of infection and helps your cut to heal better. °HOW TO TAKE CARE OF YOUR CUT °For stitches or staples: °· Keep the wound clean and dry. °· If you were given a bandage (dressing), you should change it at least one time per day or as told by your doctor. You should also change it if it gets wet or dirty. °· Keep the wound completely dry for the first 24 hours or as told by your doctor. After that time, you may take a shower or a bath. However, make sure that the wound is not soaked in water until after the stitches or staples have been removed. °· Clean the wound one time each day or as told by your doctor: °· Wash the wound with soap and water. °· Rinse the wound with water until all of the soap comes off. °· Pat the wound dry with a clean towel. Do not rub the wound. °· After you clean the wound, put a thin layer of antibiotic ointment on it as told by your doctor. This ointment: °· Helps to prevent infection. °· Keeps the bandage from sticking to the wound. °· Have your stitches or staples removed as told by your doctor. °If your doctor used skin adhesive strips:  °· Keep the wound clean and dry. °· If you were given a bandage, you should change it at least one time per day or as told by your doctor. You should also change it if it gets dirty or wet. °· Do not get the skin adhesive strips wet. You can take a shower or a bath, but be careful to keep the wound dry. °· If the wound gets wet, pat it dry with a clean towel. Do not rub the wound. °· Skin adhesive strips fall off on their own. You can trim the strips as the wound heals. Do not remove any strips that are still stuck to the wound. They will  fall off after a while. °If your doctor used wound glue: °· Try to keep your wound dry, but you may briefly wet it in the shower or bath. Do not soak the wound in water, such as by swimming. °· After you take a shower or a bath, gently pat the wound dry with a clean towel. Do not rub the wound. °· Do not do any activities that will make you really sweaty until the skin glue has fallen off on its own. °· Do not apply liquid, cream, or ointment medicine to your wound while the skin glue is still on. °· If you were given a bandage, you should change it at least one time per day or as told by your doctor. You should also change it if it gets dirty or wet. °· If a bandage is placed over the wound, do not let the tape for the bandage touch the skin glue. °· Do not pick at the glue. The skin glue usually stays on for 5-10 days. Then, it falls off of the skin. °General Instructions  °· To help prevent scarring, make sure to cover your wound with sunscreen whenever you are outside after stitches are removed, after adhesive strips are removed,   or when wound glue stays in place and the wound is healed. Make sure to wear a sunscreen of at least 30 SPF. °· Take over-the-counter and prescription medicines only as told by your doctor. °· If you were given antibiotic medicine or ointment, take or apply it as told by your doctor. Do not stop using the antibiotic even if your wound is getting better. °· Do not scratch or pick at the wound. °· Keep all follow-up visits as told by your doctor. This is important. °· Check your wound every day for signs of infection. Watch for: °· Redness, swelling, or pain. °· Fluid, blood, or pus. °· Raise (elevate) the injured area above the level of your heart while you are sitting or lying down, if possible. °GET HELP IF: °· You got a tetanus shot and you have any of these problems at the injection site: °¨ Swelling. °¨ Very bad pain. °¨ Redness. °¨ Bleeding. °· You have a fever. °· A wound that was  closed breaks open. °· You notice a bad smell coming from your wound or your bandage. °· You notice something coming out of the wound, such as wood or glass. °· Medicine does not help your pain. °· You have more redness, swelling, or pain at the site of your wound. °· You have fluid, blood, or pus coming from your wound. °· You notice a change in the color of your skin near your wound. °· You need to change the bandage often because fluid, blood, or pus is coming from the wound. °· You start to have a new rash. °· You start to have numbness around the wound. °GET HELP RIGHT AWAY IF: °· You have very bad swelling around the wound. °· Your pain suddenly gets worse and is very bad. °· You notice painful lumps near the wound or on skin that is anywhere on your body. °· You have a red streak going away from your wound. °· The wound is on your hand or foot and you cannot move a finger or toe like you usually can. °· The wound is on your hand or foot and you notice that your fingers or toes look pale or bluish. °  °This information is not intended to replace advice given to you by your health care provider. Make sure you discuss any questions you have with your health care provider. °  °Document Released: 09/10/2007 Document Revised: 08/08/2014 Document Reviewed: 03/20/2014 °Elsevier Interactive Patient Education ©2016 [employer] ° °Stitches, Staples, or Adhesive Wound Closure °Health care providers use stitches (sutures), staples, and certain glue (skin adhesives) to hold skin together while it heals (wound closure). You may need this treatment after you have surgery or if you cut your skin accidentally. These methods help your skin to heal more quickly and make it less likely that you will have a scar. A wound may take several months to heal completely. °The type of wound you have determines when your wound gets closed. In most cases, the wound is closed as soon as possible (primary skin closure). Sometimes, closure  is delayed so the wound can be cleaned and allowed to heal naturally. This reduces the chance of infection. Delayed closure may be needed if your wound: °· Is caused by a bite. °· Happened more than 6 hours ago. °· Involves loss of skin or the tissues under the skin. °· Has dirt or debris in it that cannot be removed. °· Is infected. °WHAT ARE THE DIFFERENT KINDS OF WOUND   CLOSURES? °There are many options for wound closure. The one that your health care provider uses depends on how deep and how large your wound is. °Adhesive Glue °To use this type of glue to close a wound, your health care provider holds the edges of the wound together and paints the glue on the surface of your skin. You may need more than one layer of glue. Then the wound may be covered with a light bandage (dressing). °This type of skin closure may be used for small wounds that are not deep (superficial). Using glue for wound closure is less painful than other methods. It does not require a medicine that numbs the area (local anesthetic). This method also leaves nothing to be removed. Adhesive glue is often used for children and on facial wounds. °Adhesive glue cannot be used for wounds that are deep, uneven, or bleeding. It is not used inside of a wound.  °Adhesive Strips °These strips are made of sticky (adhesive), porous paper. They are applied across your skin edges like a regular adhesive bandage. You leave them on until they fall off. °Adhesive strips may be used to close very superficial wounds. They may also be used along with sutures to improve the closure of your skin edges.  °Sutures °Sutures are the oldest method of wound closure. Sutures can be made from natural substances, such as silk, or from synthetic materials, such as nylon and steel. They can be made from a material that your body can break down as your wound heals (absorbable), or they can be made from a material that needs to be removed from your skin (nonabsorbable). They  come in many different strengths and sizes. °Your health care provider attaches the sutures to a steel needle on one end. Sutures can be passed through your skin, or through the tissues beneath your skin. Then they are tied and cut. Your skin edges may be closed in one continuous stitch or in separate stitches. °Sutures are strong and can be used for all kinds of wounds. Absorbable sutures may be used to close tissues under the skin. The disadvantage of sutures is that they may cause skin reactions that lead to infection. Nonabsorbable sutures need to be removed. °Staples °When surgical staples are used to close a wound, the edges of your skin on both sides of the wound are brought close together. A staple is placed across the wound, and an instrument secures the edges together. Staples are often used to close surgical cuts (incisions). °Staples are faster to use than sutures, and they cause less skin reaction. Staples need to be removed using a tool that bends the staples away from your skin. °HOW DO I CARE FOR MY WOUND CLOSURE? °· Take medicines only as directed by your health care provider. °· If you were prescribed an antibiotic medicine for your wound, finish it all even if you start to feel better. °· Use ointments or creams only as directed by your health care provider. °· Wash your hands with soap and water before and after touching your wound. °· Do not soak your wound in water. Do not take baths, swim, or use a hot tub until your health care provider approves. °· Ask your health care provider when you can start showering. Cover your wound if directed by your health care provider. °· Do not take out your own sutures or staples. °· Do not pick at your wound. Picking can cause an infection. °· Keep all follow-up visits as directed by   your health care provider. This is important. °HOW LONG WILL I HAVE MY WOUND CLOSURE? °· Leave adhesive glue on your skin until the glue peels away. °· Leave adhesive strips on  your skin until the strips fall off. °· Absorbable sutures will dissolve within several days. °· Nonabsorbable sutures and staples must be removed. The location of the wound will determine how long they stay in. This can range from several days to a couple of weeks. °WHEN SHOULD I SEEK HELP FOR MY WOUND CLOSURE? °Contact your health care provider if: °· You have a fever. °· You have chills. °· You have drainage, redness, swelling, or pain at your wound. °· There is a bad smell coming from your wound. °· The skin edges of your wound start to separate after your sutures have been removed. °· Your wound becomes thick, raised, and darker in color after your sutures come out (scarring). °  °This information is not intended to replace advice given to you by your health care provider. Make sure you discuss any questions you have with your health care provider. °  °Document Released: 12/17/2000 Document Revised: 04/14/2014 Document Reviewed: 08/31/2013 °Elsevier Interactive Patient Education ©2016 Elsevier Inc. ° °

## 2015-02-17 NOTE — ED Notes (Signed)
He states he lac. His right middle finger with a "clean pocket knife".  He is in no distress.  Light sterile pressure dressing applied at triage.

## 2015-03-05 ENCOUNTER — Ambulatory Visit (INDEPENDENT_AMBULATORY_CARE_PROVIDER_SITE_OTHER): Payer: Medicare Other | Admitting: Family Medicine

## 2015-03-05 ENCOUNTER — Encounter: Payer: Self-pay | Admitting: Family Medicine

## 2015-03-05 VITALS — Temp 97.6°F | Ht 67.0 in | Wt 197.8 lb

## 2015-03-05 DIAGNOSIS — R0602 Shortness of breath: Secondary | ICD-10-CM | POA: Diagnosis present

## 2015-03-05 NOTE — Patient Instructions (Signed)
Asthma, Adult Asthma is a recurring condition in which the airways tighten and narrow. Asthma can make it difficult to breathe. It can cause coughing, wheezing, and shortness of breath. Asthma episodes, also called asthma attacks, range from minor to life-threatening. Asthma cannot be cured, but medicines and lifestyle changes can help control it. CAUSES Asthma is believed to be caused by inherited (genetic) and environmental factors, but its exact cause is unknown. Asthma may be triggered by allergens, lung infections, or irritants in the air. Asthma triggers are different for each person. Common triggers include:   Animal dander.  Dust mites.  Cockroaches.  Pollen from trees or grass.  Mold.  Smoke.  Air pollutants such as dust, household cleaners, hair sprays, aerosol sprays, paint fumes, strong chemicals, or strong odors.  Cold air, weather changes, and winds (which increase molds and pollens in the air).  Strong emotional expressions such as crying or laughing hard.  Stress.  Certain medicines (such as aspirin) or types of drugs (such as beta-blockers).  Sulfites in foods and drinks. Foods and drinks that may contain sulfites include dried fruit, potato chips, and sparkling grape juice.  Infections or inflammatory conditions such as the flu, a cold, or an inflammation of the nasal membranes (rhinitis).  Gastroesophageal reflux disease (GERD).  Exercise or strenuous activity. SYMPTOMS Symptoms may occur immediately after asthma is triggered or many hours later. Symptoms include:  Wheezing.  Excessive nighttime or early morning coughing.  Frequent or severe coughing with a common cold.  Chest tightness.  Shortness of breath. DIAGNOSIS  The diagnosis of asthma is made by a review of your medical history and a physical exam. Tests may also be performed. These may include:  Lung function studies. These tests show how much air you breathe in and out.  Allergy  tests.  Imaging tests such as X-rays. TREATMENT  Asthma cannot be cured, but it can usually be controlled. Treatment involves identifying and avoiding your asthma triggers. It also involves medicines. There are 2 classes of medicine used for asthma treatment:   Controller medicines. These prevent asthma symptoms from occurring. They are usually taken every day.  Reliever or rescue medicines. These quickly relieve asthma symptoms. They are used as needed and provide short-term relief. Your health care provider will help you create an asthma action plan. An asthma action plan is a written plan for managing and treating your asthma attacks. It includes a list of your asthma triggers and how they may be avoided. It also includes information on when medicines should be taken and when their dosage should be changed. An action plan may also involve the use of a device called a peak flow meter. A peak flow meter measures how well the lungs are working. It helps you monitor your condition. HOME CARE INSTRUCTIONS   Take medicines only as directed by your health care provider. Speak with your health care provider if you have questions about how or when to take the medicines.  Use a peak flow meter as directed by your health care provider. Record and keep track of readings.  Understand and use the action plan to help minimize or stop an asthma attack without needing to seek medical care.  Control your home environment in the following ways to help prevent asthma attacks:  Do not smoke. Avoid being exposed to secondhand smoke.  Change your heating and air conditioning filter regularly.  Limit your use of fireplaces and wood stoves.  Get rid of pests (such as roaches   and mice) and their droppings.  Throw away plants if you see mold on them.  Clean your floors and dust regularly. Use unscented cleaning products.  Try to have someone else vacuum for you regularly. Stay out of rooms while they are  being vacuumed and for a short while afterward. If you vacuum, use a dust mask from a hardware store, a double-layered or microfilter vacuum cleaner bag, or a vacuum cleaner with a HEPA filter.  Replace carpet with wood, tile, or vinyl flooring. Carpet can trap dander and dust.  Use allergy-proof pillows, mattress covers, and box spring covers.  Wash bed sheets and blankets every week in hot water and dry them in a dryer.  Use blankets that are made of polyester or cotton.  Clean bathrooms and kitchens with bleach. If possible, have someone repaint the walls in these rooms with mold-resistant paint. Keep out of the rooms that are being cleaned and painted.  Wash hands frequently. SEEK MEDICAL CARE IF:   You have wheezing, shortness of breath, or a cough even if taking medicine to prevent attacks.  The colored mucus you cough up (sputum) is thicker than usual.  Your sputum changes from clear or white to yellow, green, gray, or bloody.  You have any problems that may be related to the medicines you are taking (such as a rash, itching, swelling, or trouble breathing).  You are using a reliever medicine more than 2-3 times per week.  Your peak flow is still at 50-79% of your personal best after following your action plan for 1 hour.  You have a fever. SEEK IMMEDIATE MEDICAL CARE IF:   You seem to be getting worse and are unresponsive to treatment during an asthma attack.  You are short of breath even at rest.  You get short of breath when doing very little physical activity.  You have difficulty eating, drinking, or talking due to asthma symptoms.  You develop chest pain.  You develop a fast heartbeat.  You have a bluish color to your lips or fingernails.  You are light-headed, dizzy, or faint.  Your peak flow is less than 50% of your personal best.   This information is not intended to replace advice given to you by your health care provider. Make sure you discuss any  questions you have with your health care provider.   Document Released: 03/24/2005 Document Revised: 12/13/2014 Document Reviewed: 10/21/2012 Elsevier Interactive Patient Education 2016 Elsevier Inc.  

## 2015-03-05 NOTE — Progress Notes (Signed)
   Subjective:    Patient ID: George MinorsJoseph Harvey Ingram, male    DOB: 04/01/1986, 29 y.o.   MRN: [id number]  HPI Comments: Patient reports no history of wheezing as a child. He played outside/basketball without problem. He reports his first episode of wheezing happened [facility] He and wife had URI and he reports the PA said he was wheezing. He was prescribed steroids and albuterol. Since that time he has used albuterol 2-3 times per week for SOB and this has helped.   Shortness of Breath This is a chronic problem. The current episode started more than 1 month ago. The problem occurs every several days. The problem has been unchanged. The average episode lasts 2 minutes. Pertinent negatives include no abdominal pain, chest pain, coryza, ear pain, fever, headaches, leg pain, leg swelling, orthopnea, PND, rhinorrhea or sore throat. The symptoms are aggravated by URIs. The patient has no known risk factors for DVT/PE. He has tried beta agonist inhalers for the symptoms. The treatment provided significant relief. His past medical history is significant for asthma (no known history as a child but developed wheezing with URI in Sept). There is no history of allergies, aspirin allergies, bronchiolitis, COPD, a heart failure or pneumonia.      Review of Systems  Constitutional: Negative for fever and chills.  HENT: Negative for ear pain, rhinorrhea and sore throat.   Respiratory: Positive for shortness of breath.   Cardiovascular: Negative for chest pain, orthopnea, leg swelling and PND.  Gastrointestinal: Negative for nausea and abdominal pain.  Genitourinary: Negative for dysuria.  Musculoskeletal: Negative for back pain.  Neurological: Negative for headaches.       Objective:   Physical Exam  Constitutional: He appears well-developed and well-nourished. No distress.  HENT:  Head: Normocephalic and atraumatic.  Scar on right side of head from TBI/Bone flap, well healed    Eyes: Conjunctivae are normal. No scleral icterus.  Neck: Normal range of motion. Neck supple.  Cardiovascular: Normal rate.   Pulmonary/Chest: Effort normal and breath sounds normal. No stridor. No respiratory distress. He has no wheezes. He has no rales. He exhibits no tenderness.  Abdominal: Soft.  Musculoskeletal: Normal range of motion.  Neurological: He is alert.  Skin: Skin is warm and dry. He is not diaphoretic.  Psychiatric: He has a normal mood and affect.  Nursing note and vitals reviewed.     Assessment & Plan:  George MinorsJoseph Harvey Ingram is a 29 y.o. presenting with history of SOB with URI and continued need for albuterol prn.   #Shortness of Breath:  Most likely adult on-set asthma given history. Discussed treatment vs testing with PFTs and the patient opted for testing.  - follow up with Dr. Raymondo BandKoval for PFTs - Continue albuterol  - If asthma, recommend starting qvar based on frequency of albuterol use.

## 2015-03-22 ENCOUNTER — Ambulatory Visit: Payer: Medicare Other | Admitting: Pharmacist

## 2015-04-30 ENCOUNTER — Other Ambulatory Visit: Payer: Self-pay | Admitting: *Deleted

## 2015-04-30 DIAGNOSIS — S069X5S Unspecified intracranial injury with loss of consciousness greater than 24 hours with return to pre-existing conscious level, sequela: Secondary | ICD-10-CM

## 2015-04-30 MED ORDER — DONEPEZIL HCL 10 MG PO TABS: 10.0000 mg | ORAL_TABLET | Freq: Every day | ORAL | Status: DC

## 2015-04-30 NOTE — Telephone Encounter (Signed)
Rx filled.  Katina Degree. Jimmey Ralph, MD [facility] Resident PGY-2 04/30/2015 9:50 AM

## 2015-05-07 ENCOUNTER — Other Ambulatory Visit: Payer: Self-pay | Admitting: *Deleted

## 2015-05-07 DIAGNOSIS — S069X5S Unspecified intracranial injury with loss of consciousness greater than 24 hours with return to pre-existing conscious level, sequela: Secondary | ICD-10-CM

## 2015-05-08 MED ORDER — LEVETIRACETAM 500 MG PO TABS: 1000.0000 mg | ORAL_TABLET | Freq: Two times a day (BID) | ORAL | Status: DC

## 2015-05-08 NOTE — Telephone Encounter (Signed)
Rx filled.  George Ingram. Jimmey Ralph, MD [facility] Medicine Resident PGY-2 05/08/2015 8:42 AM

## 2015-08-14 IMAGING — CT CT HEAD WITHOUT CONTRAST
1 series · 16 of 30 positions shown, 20 images · non-contrast
Comparison: none

REASON FOR EXAM: injury to left eye
COMMENTS:

PROCEDURE:     CT  - CT HEAD WITHOUT CONTRAST  - December 16, 2012  [DATE]
RESULT:     Technique: Helical 5mm sections were obtained from the skull
base to the vertex without administration of intravenous contrast.

[Series 2: soft tissue · axial · 0.44mm/px · z∈[-52,+98]mm · 16 of 34 slices shown, 20 images]
[im 2/34  brain]
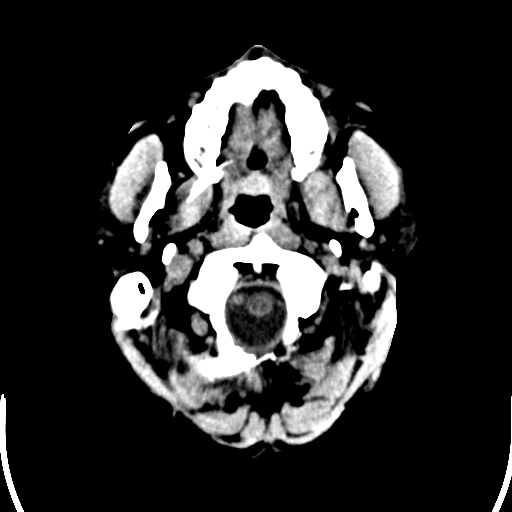
[im 2/34  bone]
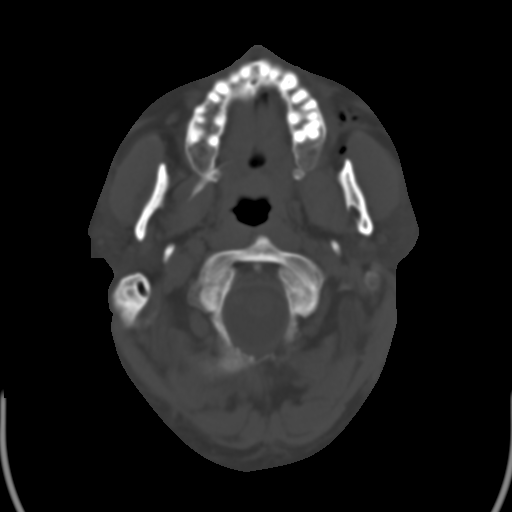
[im 4/34  brain]
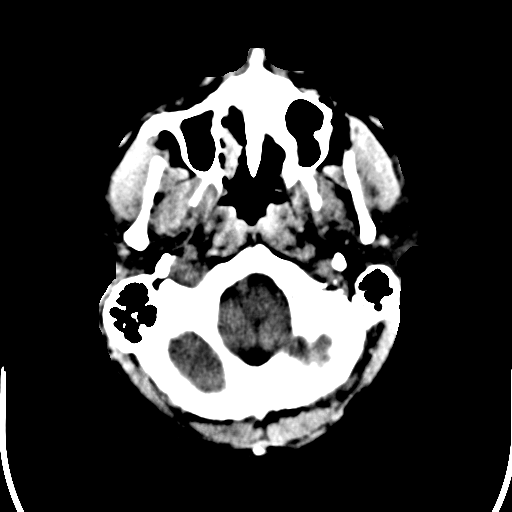
[im 6/34  brain]
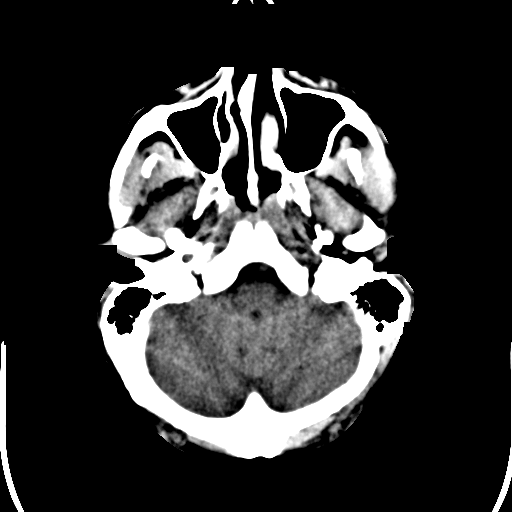
[im 8/34  brain]
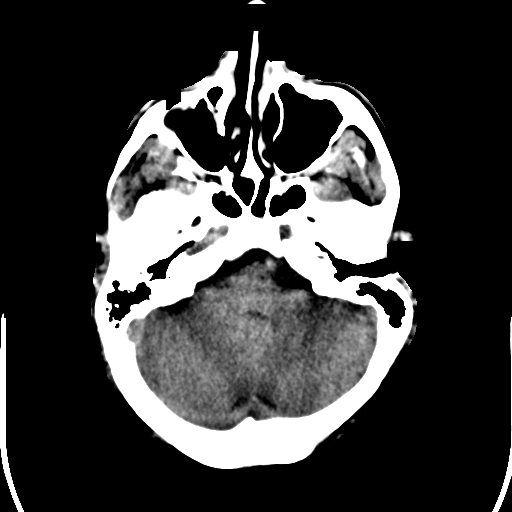
[im 10/34  brain]
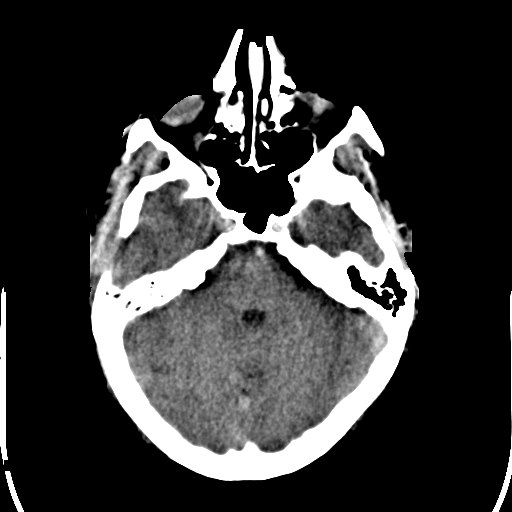
[im 10/34  bone]
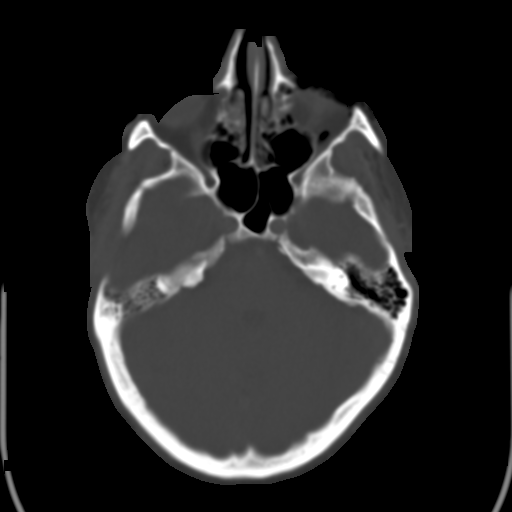
[im 12/34  brain]
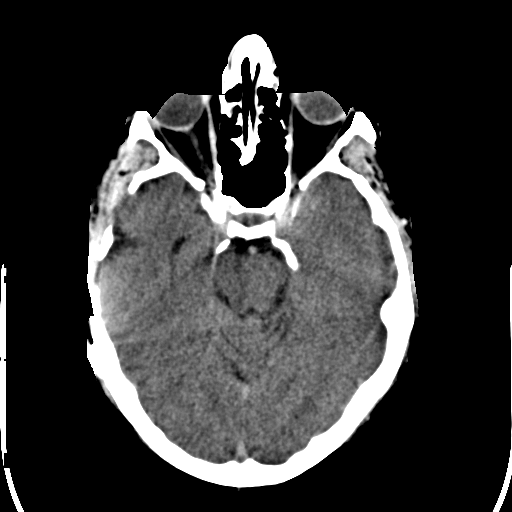
[im 14/34  brain]
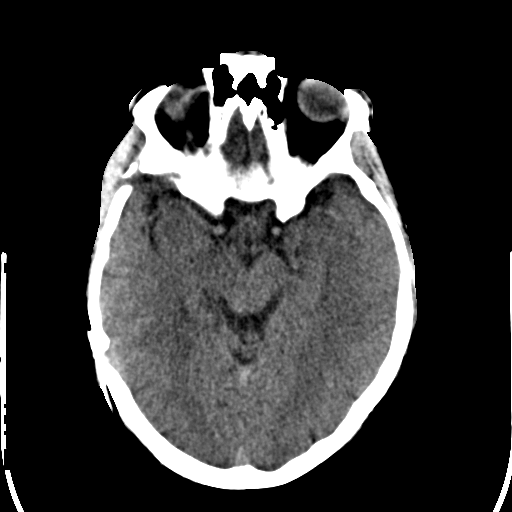
[im 16/34  brain]
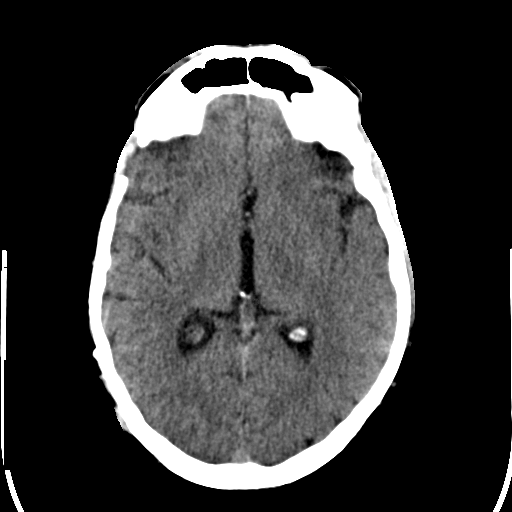
[im 18/34  brain]
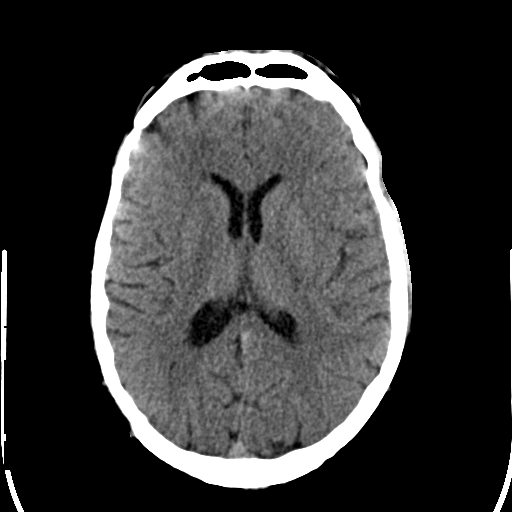
[im 18/34  bone]
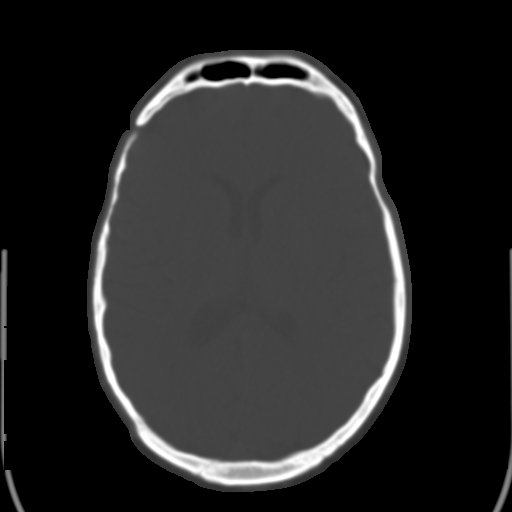
[im 20/34  brain]
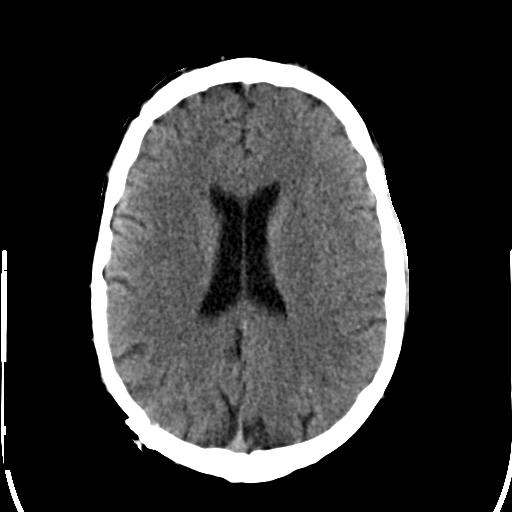
[im 22/34  brain]
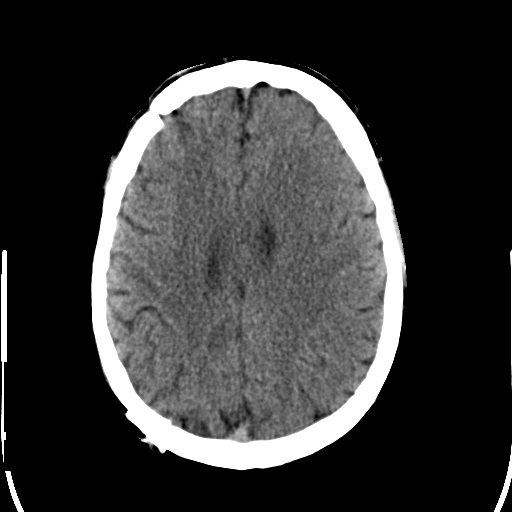
[im 24/34  brain]
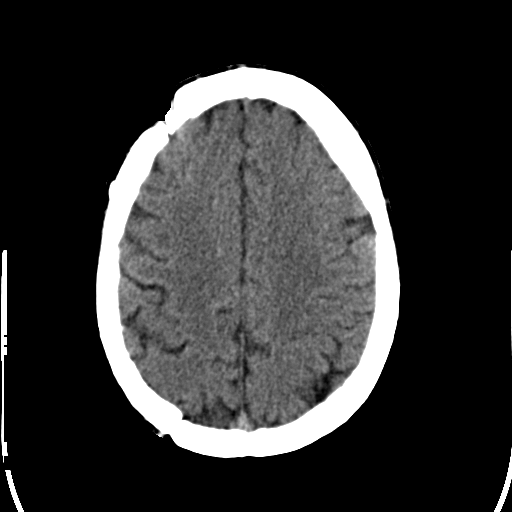
[im 26/34  brain]
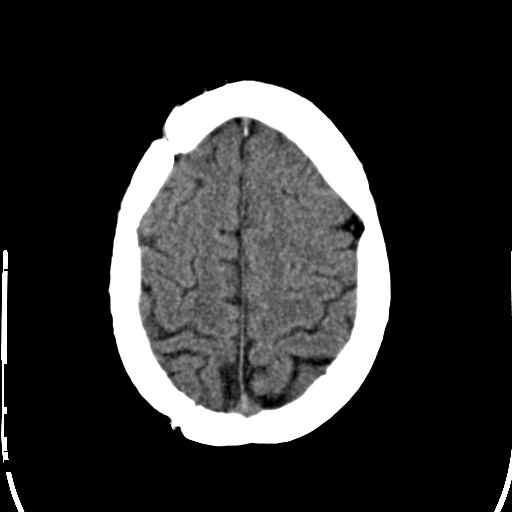
[im 26/34  bone]
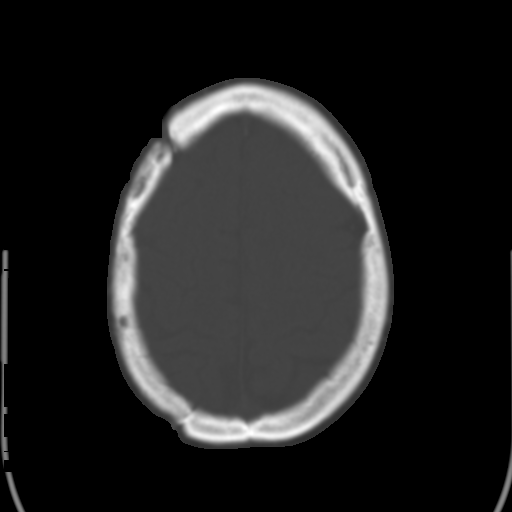
[im 28/34  brain]
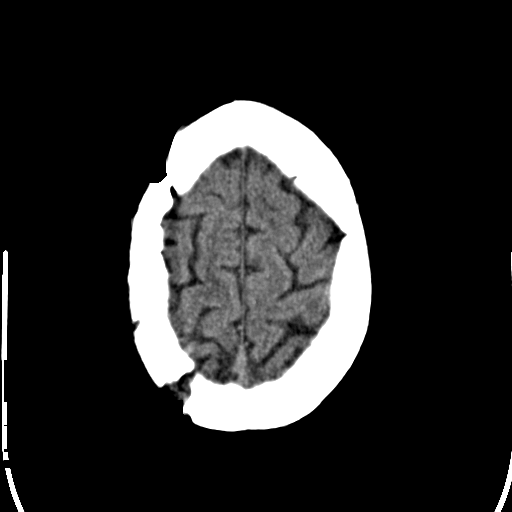
[im 30/34  brain]
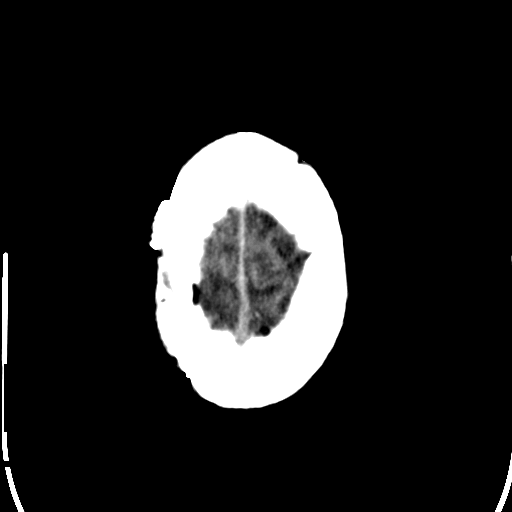
[im 32/34  brain]
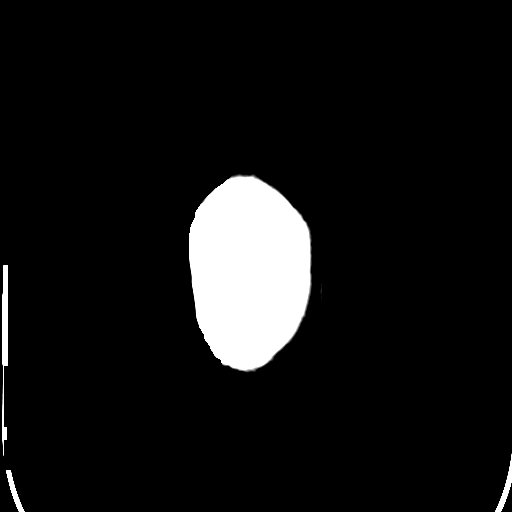

[16 of 30 positions shown; findings below may reference images not displayed]

FINDINGS: There is not evidence of intra-axial fluid collections. There is
no evidence of acute hemorrhage or secondary signs reflecting mass effect or
subacute or chronic focal territorial infarction. The osseous structures
demonstrate no evidence of a depressed skull fracture. If there is
persistent concern clinical follow-up with MRI is recommended. Periorbital
subcutaneous emphysema is identified on the left. Postsurgical changes
identified within the calvarium. The visualized paranasal sinuses and
mastoid air cells are patent.
IMPRESSION: 1. No evidence of acute intracranial abnormalities.

2. Comparison made to prior study dated 05/08/2010.

## 2015-10-08 ENCOUNTER — Other Ambulatory Visit: Payer: Self-pay | Admitting: *Deleted

## 2015-10-08 DIAGNOSIS — S069X5S Unspecified intracranial injury with loss of consciousness greater than 24 hours with return to pre-existing conscious level, sequela: Secondary | ICD-10-CM

## 2015-10-08 MED ORDER — DONEPEZIL HCL 10 MG PO TABS: 10.0000 mg | ORAL_TABLET | Freq: Every day | ORAL | Status: AC

## 2015-10-08 NOTE — Telephone Encounter (Signed)
Rx filled.  Katina Degreealeb M. Jimmey RalphParker, MD [facility] Resident PGY-3 10/08/2015 5:03 PM

## 2015-12-27 ENCOUNTER — Other Ambulatory Visit: Payer: Self-pay | Admitting: *Deleted

## 2015-12-27 DIAGNOSIS — S069X5S Unspecified intracranial injury with loss of consciousness greater than 24 hours with return to pre-existing conscious level, sequela: Secondary | ICD-10-CM

## 2015-12-28 MED ORDER — LEVETIRACETAM 500 MG PO TABS: 1000.0000 mg | ORAL_TABLET | Freq: Two times a day (BID) | ORAL | 0 refills | Status: DC

## 2015-12-28 NOTE — Telephone Encounter (Signed)
30 day supply given. Patient needs appointment for further refills.  Katina Degreealeb M. Jimmey RalphParker, MD [facility] Resident PGY-3 12/28/2015 12:14 PM

## 2016-01-02 NOTE — Telephone Encounter (Signed)
Pt informed. Fleeger, Jessica Dawn, CMA  

## 2016-01-30 ENCOUNTER — Other Ambulatory Visit: Payer: Self-pay | Admitting: Family Medicine

## 2016-01-30 DIAGNOSIS — S069X5S Unspecified intracranial injury with loss of consciousness greater than 24 hours with return to pre-existing conscious level, sequela: Secondary | ICD-10-CM

## 2016-01-30 NOTE — Telephone Encounter (Signed)
One week supply given. Patient has not been seen in nearly a year. He was informed last month that he needed an appointment for further refills. He will not get any further refills until he schedules an appointment.   Katina Degreealeb M. Jimmey RalphParker, MD [facility] Resident PGY-3 01/30/2016 12:15 PM

## 2016-01-31 NOTE — Telephone Encounter (Signed)
LM for patient to call back and schedule an appointment in the next week with PCP or blue team if he has nothing.  Price Lachapelle,CMA

## 2016-08-18 ENCOUNTER — Emergency Department
Admission: EM | Admit: 2016-08-18 | Discharge: 2016-08-18 | Disposition: A | Discharge status: Home / Self Care | Payer: Self-pay | Attending: Emergency Medicine | Admitting: Emergency Medicine

## 2016-08-18 ENCOUNTER — Encounter: Payer: Self-pay | Admitting: Emergency Medicine

## 2016-08-18 ENCOUNTER — Emergency Department: Payer: Self-pay

## 2016-08-18 DIAGNOSIS — Z87891 Personal history of nicotine dependence: Secondary | ICD-10-CM | POA: Insufficient documentation

## 2016-08-18 DIAGNOSIS — X509XXA Other and unspecified overexertion or strenuous movements or postures, initial encounter: Secondary | ICD-10-CM | POA: Insufficient documentation

## 2016-08-18 DIAGNOSIS — S161XXA Strain of muscle, fascia and tendon at neck level, initial encounter: Secondary | ICD-10-CM | POA: Diagnosis not present

## 2016-08-18 DIAGNOSIS — Y929 Unspecified place or not applicable: Secondary | ICD-10-CM | POA: Insufficient documentation

## 2016-08-18 DIAGNOSIS — Z79899 Other long term (current) drug therapy: Secondary | ICD-10-CM | POA: Insufficient documentation

## 2016-08-18 DIAGNOSIS — Y9311 Activity, swimming: Secondary | ICD-10-CM | POA: Insufficient documentation

## 2016-08-18 DIAGNOSIS — Y999 Unspecified external cause status: Secondary | ICD-10-CM | POA: Insufficient documentation

## 2016-08-18 MED ORDER — TRAMADOL HCL 50 MG PO TABS
50.0000 mg | ORAL_TABLET | Freq: Once | ORAL | Status: AC
  Administered 2016-08-18: 50 mg via ORAL
  Filled 2016-08-18: qty 1

## 2016-08-18 MED ORDER — TRAMADOL HCL 50 MG PO TABS: 50.0000 mg | ORAL_TABLET | Freq: Four times a day (QID) | ORAL | 0 refills | Status: AC | PRN

## 2016-08-18 MED ORDER — CYCLOBENZAPRINE HCL 10 MG PO TABS
10.0000 mg | ORAL_TABLET | Freq: Once | ORAL | Status: AC
  Administered 2016-08-18: 10 mg via ORAL
  Filled 2016-08-18: qty 1

## 2016-08-18 MED ORDER — CYCLOBENZAPRINE HCL 10 MG PO TABS: 10.0000 mg | ORAL_TABLET | Freq: Three times a day (TID) | ORAL | 0 refills | Status: DC | PRN

## 2016-08-18 NOTE — ED Provider Notes (Signed)
[facility] Emergency Department Provider Note   ____________________________________________   First MD Initiated Contact with Patient 08/18/16 [phone]     (approximate)  I have reviewed the triage vital signs and the nursing notes.   HISTORY  Chief Complaint Neck Pain   HPI George Ingram is a 31 y.o. male patient complaining of increasing left lateral neck pain secondary to playing in a pool with his nephew yesterday afternoon. Patient stated pain increased last night especially with movement. Patient state took Aleve last night with no relief. Patient denies any radicular component to his neck pain. Patient rates the pain as 8/10. Patient's redness pain as "achy".  Past Medical History:  Diagnosis Date  . Chronic back pain   . Meningitis    at age 61  . 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 TBI (traumatic brain injury) [facility]) 2011    Patient Active Problem List   Diagnosis Date Noted  . Arch pain of right foot 04/18/2014  . Acute sinusitis 04/18/2014  . TBI (traumatic brain injury) (HCC) 04/14/2014  . Hypoglycemia 04/14/2014    Past Surgical History:  Procedure Laterality Date  . BRAIN SURGERY  2011    Prior to Admission medications   Medication Sig Start Date End Date Taking? Authorizing Provider  albuterol (PROVENTIL HFA;VENTOLIN HFA) 108 (90 BASE) MCG/ACT inhaler Inhale 1-2 puffs into the lungs every 6 (six) hours as needed for wheezing or shortness of breath.    [provider]  cyclobenzaprine (FLEXERIL) 10 MG tablet Take 1 tablet (10 mg total) by mouth 2 (two) times daily as needed for muscle spasms. 01/23/14   Sciacca, Marissa, PA-C  cyclobenzaprine (FLEXERIL) 10 MG tablet Take 1 tablet (10 mg total) by mouth 3 (three) times daily as needed. 08/18/16   Joni Reining, PA-C  donepezil (ARICEPT) 10 MG tablet Take 1 tablet (10 mg total) by mouth daily. 10/08/15   Ardith Dark, MD  HYDROcodone-acetaminophen (NORCO/VICODIN) 5-325 MG  tablet Take 2 tablets by mouth every 4 (four) hours as needed. 02/17/15   Danelle Berry, PA-C  ibuprofen (ADVIL,MOTRIN) 800 MG tablet Take 1 tablet (800 mg total) by mouth 3 (three) times daily. 02/17/15   Danelle Berry, PA-C  levETIRAcetam (KEPPRA) 500 MG tablet TAKE 2 TABLETS TWICE A DAY 01/30/16   Ardith Dark, MD  naproxen (NAPROSYN) 500 MG tablet Take 1 tablet (500 mg total) by mouth 2 (two) times daily with a meal. 05/17/14   Ardith Dark, MD  traMADol (ULTRAM) 50 MG tablet Take 1 tablet (50 mg total) by mouth every 6 (six) hours as needed for moderate pain. 08/18/16   Joni Reining, PA-C    Allergies Patient has no known allergies.  Family History  Problem Relation Age of Onset  . Heart disease Brother     Social History Social History  Substance Use Topics  . Smoking status: Former Smoker    Quit date: 04/14/2009  . Smokeless tobacco: Never Used  . Alcohol use No    Review of Systems  Constitutional: No fever/chills Eyes: No visual changes. ENT: No sore throat. Cardiovascular: Denies chest pain. Respiratory: Denies shortness of breath. Gastrointestinal: No abdominal pain.  No nausea, no vomiting.  No diarrhea. Chronic back pain Genitourinary: Negative for dysuria. Musculoskeletal: Left lateral neck pain Skin: Negative for rash. Neurological: Negative for headaches, focal weakness or numbness.   ____________________________________________   PHYSICAL EXAM:  VITAL SIGNS: ED Triage Vitals [08/18/16 0808]  Enc  Vitals Group     BP 129/85     Pulse Rate 87     Resp 18     Temp 98.7 F (37.1 C)     Temp Source Oral     SpO2 98 %     Weight 210 lb (95.3 kg)     Height 5\' 8"  (1.727 m)     Head Circumference      Peak Flow      Pain Score 8     Pain Loc      Pain Edu?      Excl. in GC?     Constitutional: Alert and oriented. Well appearing and in no acute distress. Eyes: Conjunctivae are normal. PERRL. EOMI. Head: Atraumatic. Nose: No  congestion/rhinnorhea. Mouth/Throat: Mucous membranes are moist.  Oropharynx non-erythematous. Neck: No stridor.  No cervical spine tenderness to palpation. Hematological/Lymphatic/Immunilogical: No cervical lymphadenopathy. Cardiovascular: Normal rate, regular rhythm. Grossly normal heart sounds.  Good peripheral circulation. Respiratory: Normal respiratory effort.  No retractions. Lungs CTAB. Gastrointestinal: Soft and nontender. No distention. No abdominal bruits. No CVA tenderness. Musculoskeletal:  No obvious spinal deformity. No guarding palpation spinal processes. Patient decreased range of motion right lateral flexion of the neck.  Neurologic:  Normal speech and language. No gross focal neurologic deficits are appreciated. No gait instability. Skin:  Skin is warm, dry and intact. No rash noted. Psychiatric: Mood and affect are normal. Speech and behavior are normal.  ____________________________________________   LABS (all labs ordered are listed, but only abnormal results are displayed)  Labs Reviewed - No data to display ____________________________________________  EKG   ____________________________________________  RADINo acute findings x-ray of the cervical spine.  ____________________________________________   PROCEDURES  Procedure(s) performed: None  Procedures  Critical Care performed: No  ____________________________________________   INITIAL IMPRESSION / ASSESSMENT AND PLAN / ED COURSE  Pertinent labs & imaging results that were available during my care of the patient were reviewed by me and considered in my medical decision making (see chart for detailsCervical strain. Discussed negative x-ray finding with patient. Patient given discharge Instructions and a work note for today. Patient [facility]       ____________________________________________   FINAL CLINICAL IMPRESSION(S) / ED  DIAGNOSES  Final diagnoses:  Acute strain of neck muscle, initial encounter      NEW MEDICATIONS STARTED DURING THIS VISIT:  New Prescriptions   CYCLOBENZAPRINE (FLEXERIL) 10 MG TABLET    Take 1 tablet (10 mg total) by mouth 3 (three) times daily as needed.   TRAMADOL (ULTRAM) 50 MG TABLET    Take 1 tablet (50 mg total) by mouth every 6 (six) hours as needed for moderate pain.     Note:  This document was prepared using Dragon voice recognition software and may include unintentional dictation errors.    Joni ReiningSmith, Trigg Delarocha K, PA-C 08/18/16 [id number]    Nita SickleVeronese, [place], MD 08/18/16 1330

## 2016-08-18 NOTE — ED Triage Notes (Signed)
Pt states he was playing in the pool with nephew and hurt the back left side of his neck. Pt has full ROM. Appears in no distress.

## 2016-08-18 NOTE — ED Notes (Signed)
Pt to ed with c/o neck pain after playing in the pool yesterday.  Pt states it started hurting last night.  Pt with good ROM but reports increased pain with movement.  Pt states took aleve at 1030 last night without relief.

## 2016-11-09 ENCOUNTER — Encounter: Payer: Self-pay | Admitting: Emergency Medicine

## 2016-11-09 ENCOUNTER — Emergency Department
Admission: EM | Admit: 2016-11-09 | Discharge: 2016-11-09 | Disposition: A | Discharge status: Home / Self Care | Payer: Medicare Other | Attending: Emergency Medicine | Admitting: Emergency Medicine

## 2016-11-09 DIAGNOSIS — Z79899 Other long term (current) drug therapy: Secondary | ICD-10-CM | POA: Insufficient documentation

## 2016-11-09 DIAGNOSIS — Y939 Activity, unspecified: Secondary | ICD-10-CM | POA: Insufficient documentation

## 2016-11-09 DIAGNOSIS — W269XXA Contact with unspecified sharp object(s), initial encounter: Secondary | ICD-10-CM | POA: Insufficient documentation

## 2016-11-09 DIAGNOSIS — Y92019 Unspecified place in single-family (private) house as the place of occurrence of the external cause: Secondary | ICD-10-CM | POA: Insufficient documentation

## 2016-11-09 DIAGNOSIS — S61215A Laceration without foreign body of left ring finger without damage to nail, initial encounter: Secondary | ICD-10-CM | POA: Insufficient documentation

## 2016-11-09 DIAGNOSIS — Z23 Encounter for immunization: Secondary | ICD-10-CM | POA: Insufficient documentation

## 2016-11-09 DIAGNOSIS — Y998 Other external cause status: Secondary | ICD-10-CM | POA: Insufficient documentation

## 2016-11-09 DIAGNOSIS — Z87891 Personal history of nicotine dependence: Secondary | ICD-10-CM | POA: Insufficient documentation

## 2016-11-09 MED ORDER — TETANUS-DIPHTH-ACELL PERTUSSIS 5-2.5-18.5 LF-MCG/0.5 IM SUSP
0.5000 mL | Freq: Once | INTRAMUSCULAR | Status: AC
  Administered 2016-11-09: 0.5 mL via INTRAMUSCULAR
  Filled 2016-11-09: qty 0.5

## 2016-11-09 MED ORDER — TETANUS-DIPHTH-ACELL PERTUSSIS 5-2.5-18.5 LF-MCG/0.5 IM SUSP: 0.5000 mL | Freq: Once | INTRAMUSCULAR | Status: DC

## 2016-11-09 NOTE — ED Provider Notes (Signed)
[facility] Emergency Department Provider Note   ____________________________________________   First MD Initiated Contact with Patient 11/09/16 1539     (approximate)  I have reviewed the triage vital signs and the nursing notes.   HISTORY  Chief Complaint Laceration    HPI George Ingram is a 31 y.o. male Patient present with laceration to 4th digit left hand. Patient was trying to tighten a loose bolt inside a dishwasher. Patient denies loss of sendation or function of finger. Tetanus shot is not up to date. Denies pain. Bleeding controlled with direct pressure. Past Medical History:  Diagnosis Date  . Chronic back pain   . Meningitis    at age 89  . 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 TBI (traumatic brain injury) [facility](HCC) 2011    Patient Active Problem List   Diagnosis Date Noted  . Arch pain of right foot 04/18/2014  . Acute sinusitis 04/18/2014  . TBI (traumatic brain injury) (HCC) 04/14/2014  . Hypoglycemia 04/14/2014    Past Surgical History:  Procedure Laterality Date  . BRAIN SURGERY  2011    Prior to Admission medications   Medication Sig Start Date End Date Taking? Authorizing Provider  albuterol (PROVENTIL HFA;VENTOLIN HFA) 108 (90 BASE) MCG/ACT inhaler Inhale 1-2 puffs into the lungs every 6 (six) hours as needed for wheezing or shortness of breath.    [provider]  cyclobenzaprine (FLEXERIL) 10 MG tablet Take 1 tablet (10 mg total) by mouth 2 (two) times daily as needed for muscle spasms. 01/23/14   Sciacca, Marissa, PA-C  cyclobenzaprine (FLEXERIL) 10 MG tablet Take 1 tablet (10 mg total) by mouth 3 (three) times daily as needed. 08/18/16   Joni ReiningSmith, Ronald K, PA-C  donepezil (ARICEPT) 10 MG tablet Take 1 tablet (10 mg total) by mouth daily. 10/08/15   Ardith DarkParker, Caleb M, MD  HYDROcodone-acetaminophen (NORCO/VICODIN) 5-325 MG tablet Take 2 tablets by mouth every 4 (four) hours as needed. 02/17/15   Danelle Berryapia, Leisa, PA-C  ibuprofen  (ADVIL,MOTRIN) 800 MG tablet Take 1 tablet (800 mg total) by mouth 3 (three) times daily. 02/17/15   Danelle Berryapia, Leisa, PA-C  levETIRAcetam (KEPPRA) 500 MG tablet TAKE 2 TABLETS TWICE A DAY 01/30/16   Ardith DarkParker, Caleb M, MD  naproxen (NAPROSYN) 500 MG tablet Take 1 tablet (500 mg total) by mouth 2 (two) times daily with a meal. 05/17/14   Ardith DarkParker, Caleb M, MD  traMADol (ULTRAM) 50 MG tablet Take 1 tablet (50 mg total) by mouth every 6 (six) hours as needed for moderate pain. 08/18/16   Joni ReiningSmith, Ronald K, PA-C    Allergies Patient has no known allergies.  Family History  Problem Relation Age of Onset  . Heart disease Brother     Social History Social History  Substance Use Topics  . Smoking status: Former Smoker    Quit date: 04/14/2009  . Smokeless tobacco: Never Used  . Alcohol use No    Review of Systems  Constitutional: No fever/chills Eyes: No visual changes. ENT: No sore throat. Cardiovascular: Denies chest pain. Respiratory: Denies shortness of breath. Gastrointestinal: No abdominal pain.  No nausea, no vomiting.  No diarrhea.  No constipation. Genitourinary: Negative for dysuria. Musculoskeletal: Negative for back pain. Skin: Negative for rash. Finger laceration Neurological: Negative for headaches, focal weakness or numbness.   ____________________________________________   PHYSICAL EXAM:  VITAL SIGNS: ED Triage Vitals [11/09/16 1513]  Enc Vitals Group     BP (!) 135/92  Pulse Rate 84     Resp 18     Temp 97.7 F (36.5 C)     Temp Source Oral     SpO2 98 %     Weight 210 lb (95.3 kg)     Height 5\' 9"  (1.753 m)     Head Circumference      Peak Flow      Pain Score 0     Pain Loc      Pain Edu?      Excl. in GC?     Constitutional: Alert and oriented. Well appearing and in no acute distress. Cardiovascular: Normal rate, regular rhythm. Grossly normal heart sounds.  Good peripheral circulation. Respiratory: Normal respiratory effort.  No retractions. Lungs  CTAB. Neurologic:  Normal speech and language. No gross focal neurologic deficits are appreciated. No gait instability. Skin:  Skin is warm, dry and intact. No rash noted.0.2 cm laceration dorsal aspect of fourth digit left hand. Psychiatric: Mood and affect are normal. Speech and behavior are normal.  ____________________________________________   LABS (all labs ordered are listed, but only abnormal results are displayed)  Labs Reviewed - No data to display ____________________________________________  EKG   ____________________________________________  RADIOLOGY  No results found.  ____________________________________________   PROCEDURES  Procedure(s) performed: None  Procedures  Critical Care performed: No  ____________________________________________   INITIAL IMPRESSION / ASSESSMENT AND PLAN / ED COURSE  Pertinent labs & imaging results that were available during my care of the patient were reviewed by me and considered in my medical decision making (see chart for details).  Left fourth finger laceration. Patient given discharge care instructions. Patient given tetanus shot prior to departure. Patient advised to return to ER if wound reopened full healing is complete.      ____________________________________________   FINAL CLINICAL IMPRESSION(S) / ED DIAGNOSES  Final diagnoses:  Laceration of left ring finger without damage to nail, foreign body presence unspecified, initial encounter      NEW MEDICATIONS STARTED DURING THIS VISIT:  New Prescriptions   No medications on file     Note:  This document was prepared using Dragon voice recognition software and may include unintentional dictation errors.    Joni ReiningSmith, Ronald K, PA-C 11/09/16 1621    Joni ReiningSmith, Ronald K, PA-C 11/09/16 1701    Schaevitz, Myra Rudeavid Matthew, MD 11/09/16 2225

## 2016-11-09 NOTE — ED Notes (Signed)
See triage note. Left hand finger #4 with lac. Not bleeding at this time.

## 2016-11-09 NOTE — ED Notes (Signed)
FIRST NURSE NOTE:  Pt states he cut his left ring finger on a piece of pottery.

## 2016-11-09 NOTE — ED Triage Notes (Signed)
Lac to left ring finger, bleeding controlled at this time.  Pt removed wedding ring and gave to wife.

## 2016-11-09 NOTE — Discharge Instructions (Signed)
Finger splint for 2-3 days as needed °

## 2016-11-09 NOTE — ED Notes (Signed)
NAD noted at time of D/C. Pt denies questions or concerns. Pt ambulatory to the lobby at this time.  

## 2016-11-09 NOTE — ED Notes (Signed)
Pt presents with laceration to 4th digit of L hand. Pt states cut hand on broken pottery, presents with laceration over the knuckle.

## 2018-08-30 ENCOUNTER — Encounter (HOSPITAL_COMMUNITY): Payer: Self-pay | Admitting: *Deleted

## 2018-08-30 ENCOUNTER — Other Ambulatory Visit: Payer: Self-pay

## 2018-08-30 ENCOUNTER — Emergency Department (HOSPITAL_COMMUNITY)
Admission: EM | Admit: 2018-08-30 | Discharge: 2018-08-30 | Disposition: A | Discharge status: Home / Self Care | Payer: Medicare Other | Attending: Emergency Medicine | Admitting: Emergency Medicine

## 2018-08-30 DIAGNOSIS — Z87891 Personal history of nicotine dependence: Secondary | ICD-10-CM | POA: Insufficient documentation

## 2018-08-30 DIAGNOSIS — W260XXA Contact with knife, initial encounter: Secondary | ICD-10-CM | POA: Insufficient documentation

## 2018-08-30 DIAGNOSIS — Y929 Unspecified place or not applicable: Secondary | ICD-10-CM | POA: Insufficient documentation

## 2018-08-30 DIAGNOSIS — Y939 Activity, unspecified: Secondary | ICD-10-CM | POA: Insufficient documentation

## 2018-08-30 DIAGNOSIS — S61211A Laceration without foreign body of left index finger without damage to nail, initial encounter: Secondary | ICD-10-CM

## 2018-08-30 DIAGNOSIS — Z79899 Other long term (current) drug therapy: Secondary | ICD-10-CM | POA: Insufficient documentation

## 2018-08-30 DIAGNOSIS — Z23 Encounter for immunization: Secondary | ICD-10-CM | POA: Insufficient documentation

## 2018-08-30 DIAGNOSIS — Y999 Unspecified external cause status: Secondary | ICD-10-CM | POA: Insufficient documentation

## 2018-08-30 MED ORDER — TETANUS-DIPHTH-ACELL PERTUSSIS 5-2.5-18.5 LF-MCG/0.5 IM SUSP
0.5000 mL | Freq: Once | INTRAMUSCULAR | Status: AC
  Administered 2018-08-30: 19:00:00 0.5 mL via INTRAMUSCULAR
  Filled 2018-08-30: qty 0.5

## 2018-08-30 NOTE — ED Provider Notes (Signed)
[facility]ERGENC[facility] Provider Note   CSN: [id number] Arrival date & time: 08/30/18  1748    History   Chief Complaint Chief Complaint  Patient presents with  . Laceration    HPI George Ingram is a 33 y.o. male with history of TBI who presents with left index finger laceration from a pocket knife.  He reports he was trying to fix something on his dogs crate.  He is left-handed.  He reports he washed it out with peroxide and put gauze on it.  He reports returning from a walk-in and was still bleeding.  His tetanus is not up-to-date.  He denies any other injuries.     HPI  Past Medical History:  Diagnosis Date  . Chronic back pain   . Meningitis    at age 89  . 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 TBI (traumatic brain injury) [facility](HCC) 2011    Patient Active Problem List   Diagnosis Date Noted  . Arch pain of right foot 04/18/2014  . Acute sinusitis 04/18/2014  . TBI (traumatic brain injury) (HCC) 04/14/2014  . Hypoglycemia 04/14/2014    Past Surgical History:  Procedure Laterality Date  . BRAIN SURGERY  2011        Home Medications    Prior to Admission medications   Medication Sig Start Date End Date Taking? Authorizing Provider  albuterol (PROVENTIL HFA;VENTOLIN HFA) 108 (90 BASE) MCG/ACT inhaler Inhale 1-2 puffs into the lungs every 6 (six) hours as needed for wheezing or shortness of breath.    [provider]  cyclobenzaprine (FLEXERIL) 10 MG tablet Take 1 tablet (10 mg total) by mouth 2 (two) times daily as needed for muscle spasms. 01/23/14   Sciacca, Marissa, PA-C  cyclobenzaprine (FLEXERIL) 10 MG tablet Take 1 tablet (10 mg total) by mouth 3 (three) times daily as needed. 08/18/16   Joni ReiningSmith, Ronald K, PA-C  donepezil (ARICEPT) 10 MG tablet Take 1 tablet (10 mg total) by mouth daily. 10/08/15   Ardith DarkParker, Caleb M, MD  HYDROcodone-acetaminophen (NORCO/VICODIN) 5-325 MG tablet Take 2 tablets by mouth every 4 (four) hours as needed.  02/17/15   Danelle Berryapia, Leisa, PA-C  ibuprofen (ADVIL,MOTRIN) 800 MG tablet Take 1 tablet (800 mg total) by mouth 3 (three) times daily. 02/17/15   Danelle Berryapia, Leisa, PA-C  levETIRAcetam (KEPPRA) 500 MG tablet TAKE 2 TABLETS TWICE A DAY 01/30/16   Ardith DarkParker, Caleb M, MD  naproxen (NAPROSYN) 500 MG tablet Take 1 tablet (500 mg total) by mouth 2 (two) times daily with a meal. 05/17/14   Ardith DarkParker, Caleb M, MD  traMADol (ULTRAM) 50 MG tablet Take 1 tablet (50 mg total) by mouth every 6 (six) hours as needed for moderate pain. 08/18/16   Joni ReiningSmith, Ronald K, PA-C    Family History Family History  Problem Relation Age of Onset  . Heart disease Brother     Social History Social History   Tobacco Use  . Smoking status: Former Smoker    Last attempt to quit: 04/14/2009    Years since quitting: 9.3  . Smokeless tobacco: Never Used  Substance Use Topics  . Alcohol use: No  . Drug use: No     Allergies   Patient has no known allergies.   Review of Systems Review of Systems  Skin: Positive for wound.  Neurological: Negative for numbness.     Physical Exam Updated Vital Signs BP 126/90 (BP Location: Right Arm)   Pulse 86  Temp 98.2 F (36.8 C) (Oral)   Resp 16   Ht  (1.702 m)   Wt 90.7 kg   SpO2 98%   BMI 31.32 kg/m   Physical Exam Vitals signs and nursing note reviewed.  Constitutional:      General: He is not in acute distress.    Appearance: He is well-developed. He is not diaphoretic.  HENT:     Head: Normocephalic and atraumatic.     Mouth/Throat:     Pharynx: No oropharyngeal exudate.  Eyes:     General: No scleral icterus.       Right eye: No discharge.        Left eye: No discharge.     Conjunctiva/sclera: Conjunctivae normal.     Pupils: Pupils are equal, round, and reactive to light.  Neck:     Musculoskeletal: Normal range of motion and neck supple.     Thyroid: No thyromegaly.  Cardiovascular:     Rate and Rhythm: Normal rate and regular rhythm.     Heart sounds:  Normal heart sounds. No murmur. No friction rub. No gallop.   Pulmonary:     Effort: Pulmonary effort is normal. No respiratory distress.     Breath sounds: Normal breath sounds. No stridor. No wheezing or rales.  Musculoskeletal:       Hands:  Lymphadenopathy:     Cervical: No cervical adenopathy.  Skin:    General: Skin is warm and dry.     Coloration: Skin is not pale.     Findings: No rash.  Neurological:     Mental Status: He is alert.     Coordination: Coordination normal.      ED Treatments / Results  Labs (all labs ordered are listed, but only abnormal results are displayed) Labs Reviewed - No data to display  EKG None  Radiology No results found.  Procedures .Marland KitchenLaceration Repair Date/Time: 08/30/2018 9:05 PM Performed by: Emi Holes, PA-C Authorized by: Emi Holes, PA-C   Consent:    Consent obtained:  Verbal   Consent given by:  Patient   Risks discussed:  Infection and pain   Alternatives discussed:  No treatment Anesthesia (see MAR for exact dosages):    Anesthesia method:  None Laceration details:    Location:  Finger   Finger location:  L index finger   Length (cm):  1   Depth (mm):  2 Repair type:    Repair type:  Simple Exploration:    Hemostasis achieved with:  Direct pressure   Wound exploration: wound explored through full range of motion and entire depth of wound probed and visualized     Wound extent: no foreign bodies/material noted, no muscle damage noted, no nerve damage noted and no tendon damage noted     Contaminated: no   Treatment:    Area cleansed with:  Saline   Amount of cleaning:  Standard   Irrigation solution:  Sterile saline   Irrigation volume:  40mL   Irrigation method:  Syringe   Visualized foreign bodies/material removed: no   Skin repair:    Repair method:  Tissue adhesive Approximation:    Approximation:  Close Post-procedure details:    Dressing:  Sterile dressing and splint for protection    Patient tolerance of procedure:  Tolerated well, no immediate complications   (including critical care time)  Medications Ordered in ED Medications  Tdap (BOOSTRIX) injection 0.5 mL (0.5 mLs Intramuscular Given 08/30/18 1907)  Initial Impression / Assessment and Plan / ED Course  I have reviewed the triage vital signs and the nursing notes.  Pertinent labs & imaging results that were available during my care of the patient were reviewed by me and considered in my medical decision making (see chart for details).        Patient presenting with superficial laceration to left index finger.  Tetanus updated.  Wound repaired with Dermabond following irrigation.  Patient placed in splint for protection.  Wound care discussed.  Patient understands and agrees with plan.  Return precautions discussed.  Patient vitals stable throughout ED course and discharged in satisfactory condition.  Final Clinical Impressions(s) / ED Diagnoses   Final diagnoses:  Laceration of left index finger without foreign body without damage to nail, initial encounter    ED Discharge Orders    None       Verdis Prime 08/30/18 2107    Terrilee Files, MD 08/31/18 1153

## 2018-08-30 NOTE — Discharge Instructions (Signed)
Wear splint for the next few days to help prevent reopening.  Do not get your wound wet for the first 24 hours.  After that, you can get it wet to wash your hands, but do not submerge your finger for 1 week.  Please return the emergency department develop any new or worsening symptoms including increasing pain, redness, swelling, red streaking from the wound, drainage, or any other new or concerning symptoms.

## 2018-08-30 NOTE — ED Triage Notes (Signed)
PT presents to ED  With Lac to Lt index finger. Pt reports cutting finger on pocket knife  Today.

## 2018-08-30 NOTE — ED Notes (Signed)
Patient verbalizes understanding of discharge instructions. Opportunity for questioning and answers were provided. Armband removed by staff, pt discharged from ED.  

## 2019-01-02 ENCOUNTER — Encounter (HOSPITAL_COMMUNITY): Payer: Self-pay

## 2019-01-02 ENCOUNTER — Emergency Department (HOSPITAL_COMMUNITY)
Admission: EM | Admit: 2019-01-02 | Discharge: 2019-01-02 | Disposition: A | Discharge status: Home / Self Care | Payer: Self-pay | Attending: Emergency Medicine | Admitting: Emergency Medicine

## 2019-01-02 ENCOUNTER — Other Ambulatory Visit: Payer: Self-pay

## 2019-01-02 DIAGNOSIS — Y999 Unspecified external cause status: Secondary | ICD-10-CM | POA: Insufficient documentation

## 2019-01-02 DIAGNOSIS — Y929 Unspecified place or not applicable: Secondary | ICD-10-CM | POA: Insufficient documentation

## 2019-01-02 DIAGNOSIS — Z79899 Other long term (current) drug therapy: Secondary | ICD-10-CM | POA: Insufficient documentation

## 2019-01-02 DIAGNOSIS — Y93G1 Activity, food preparation and clean up: Secondary | ICD-10-CM | POA: Insufficient documentation

## 2019-01-02 DIAGNOSIS — Z87891 Personal history of nicotine dependence: Secondary | ICD-10-CM | POA: Insufficient documentation

## 2019-01-02 DIAGNOSIS — S61211A Laceration without foreign body of left index finger without damage to nail, initial encounter: Secondary | ICD-10-CM | POA: Insufficient documentation

## 2019-01-02 DIAGNOSIS — W260XXA Contact with knife, initial encounter: Secondary | ICD-10-CM | POA: Insufficient documentation

## 2019-01-02 MED ORDER — LIDOCAINE HCL (PF) 1 % IJ SOLN
5.0000 mL | Freq: Once | INTRAMUSCULAR | Status: AC
  Administered 2019-01-02: 17:00:00 5 mL
  Filled 2019-01-02: qty 5

## 2019-01-02 NOTE — ED Triage Notes (Signed)
Pt was washing dishes and knife slipped and cut his left index finger. Not bleeding at this time.

## 2019-01-02 NOTE — ED Provider Notes (Signed)
[facility] Note   CSN: [id number] Arrival date & time: 01/02/19  1525     History   Chief Complaint Chief Complaint  Patient presents with  . Laceration    Left index     HPI George Ingram is a 33 y.o. male.     George Ingram is a 33 y.o. male with a history of TBI, seizures, meningitis, and chronic back pain, who presents to the ED for evaluation of finger laceration.  He reports that he was washing dishes and the knife slipped cutting the dorsal surface of his left index finger just distal to the MCP joint.  He reports the cut is fairly superficial but due to the location he was having trouble getting it to stop bleeding.  He reports it would seem to stop and that he would move his finger and it would start to get.  He denies numbness, he is able to move the finger without difficulty.  Tetanus is up-to-date.  No other aggravating or alleviating factors.     Past Medical History:  Diagnosis Date  . Chronic back pain   . Meningitis    at age 89  . 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 TBI (traumatic brain injury) [facility](HCC) 2011    Patient Active Problem List   Diagnosis Date Noted  . Arch pain of right foot 04/18/2014  . Acute sinusitis 04/18/2014  . TBI (traumatic brain injury) (HCC) 04/14/2014  . Hypoglycemia 04/14/2014    Past Surgical History:  Procedure Laterality Date  . BRAIN SURGERY  2011        Home Medications    Prior to Admission medications   Medication Sig Start Date End Date Taking? Authorizing Provider  albuterol (PROVENTIL HFA;VENTOLIN HFA) 108 (90 BASE) MCG/ACT inhaler Inhale 1-2 puffs into the lungs every 6 (six) hours as needed for wheezing or shortness of breath.    [provider]  cyclobenzaprine (FLEXERIL) 10 MG tablet Take 1 tablet (10 mg total) by mouth 2 (two) times daily as needed for muscle spasms. 01/23/14   Sciacca, Marissa, PA-C  cyclobenzaprine (FLEXERIL) 10 MG tablet  Take 1 tablet (10 mg total) by mouth 3 (three) times daily as needed. 08/18/16   Joni ReiningSmith, Ronald K, PA-C  donepezil (ARICEPT) 10 MG tablet Take 1 tablet (10 mg total) by mouth daily. 10/08/15   Ardith DarkParker, Caleb M, MD  HYDROcodone-acetaminophen (NORCO/VICODIN) 5-325 MG tablet Take 2 tablets by mouth every 4 (four) hours as needed. 02/17/15   Danelle Berryapia, Leisa, PA-C  ibuprofen (ADVIL,MOTRIN) 800 MG tablet Take 1 tablet (800 mg total) by mouth 3 (three) times daily. 02/17/15   Danelle Berryapia, Leisa, PA-C  levETIRAcetam (KEPPRA) 500 MG tablet TAKE 2 TABLETS TWICE A DAY 01/30/16   Ardith DarkParker, Caleb M, MD  naproxen (NAPROSYN) 500 MG tablet Take 1 tablet (500 mg total) by mouth 2 (two) times daily with a meal. 05/17/14   Ardith DarkParker, Caleb M, MD  traMADol (ULTRAM) 50 MG tablet Take 1 tablet (50 mg total) by mouth every 6 (six) hours as needed for moderate pain. 08/18/16   Joni ReiningSmith, Ronald K, PA-C    Family History Family History  Problem Relation Age of Onset  . Heart disease Brother     Social History Social History   Tobacco Use  . Smoking status: Former Smoker    Quit date: 04/14/2009    Years since quitting: 9.7  . Smokeless tobacco: Never Used  Substance Use Topics  .  Alcohol use: No  . Drug use: No     Allergies   Patient has no known allergies.   Review of Systems Review of Systems  Constitutional: Negative for chills and fever.  Skin: Positive for wound.  Neurological: Negative for weakness and numbness.     Physical Exam Updated Vital Signs BP 124/90   Pulse 92   Temp 97.6 F (36.4 C) (Oral)   SpO2 97%   Physical Exam Vitals signs and nursing note reviewed.  Constitutional:      General: He is not in acute distress.    Appearance: He is well-developed. He is not diaphoretic.  HENT:     Head: Normocephalic and atraumatic.  Eyes:     General:        Right eye: No discharge.        Left eye: No discharge.  Pulmonary:     Effort: Pulmonary effort is normal. No respiratory distress.   Musculoskeletal:     Comments: Left index finger with 2 cm superficial laceration just distal to the MCP joint, no active bleeding.  Patient has 5/5 strength with flexion and extension.  Normal sensation, 2+ radial pulse and good cap refill.  Skin:    General: Skin is warm and dry.  Neurological:     Mental Status: He is alert.     Coordination: Coordination normal.  Psychiatric:        Behavior: Behavior normal.      ED Treatments / Results  Labs (all labs ordered are listed, but only abnormal results are displayed) Labs Reviewed - No data to display  EKG None  Radiology No results found.  Procedures .Marland KitchenLaceration Repair  Date/Time: 01/02/2019 4:48 PM Performed by: Jacqlyn Larsen, PA-C Authorized by: Jacqlyn Larsen, PA-C   Consent:    Consent obtained:  Verbal   Consent given by:  Patient   Risks discussed:  Infection, pain, poor cosmetic result and poor wound healing   Alternatives discussed:  No treatment Anesthesia (see MAR for exact dosages):    Anesthesia method:  Local infiltration   Local anesthetic:  Lidocaine 1% w/o epi Laceration details:    Location:  Finger   Finger location:  L index finger   Length (cm):  2   Depth (mm):  5 Repair type:    Repair type:  Simple Pre-procedure details:    Preparation:  Patient was prepped and draped in usual sterile fashion Exploration:    Hemostasis achieved with:  Direct pressure   Wound exploration: wound explored through full range of motion and entire depth of wound probed and visualized     Wound extent: areolar tissue violated   Treatment:    Area cleansed with:  Saline   Amount of cleaning:  Standard   Irrigation solution:  Sterile saline   Irrigation method:  Syringe Skin repair:    Repair method:  Sutures   Suture size:  4-0   Suture material:  Prolene   Number of sutures:  2 Approximation:    Approximation:  Close Post-procedure details:    Dressing:  Adhesive bandage and antibiotic ointment    Patient tolerance of procedure:  Tolerated well, no immediate complications   (including critical care time)  Medications Ordered in ED Medications  lidocaine (PF) (XYLOCAINE) 1 % injection 5 mL (has no administration in time range)     Initial Impression / Assessment and Plan / ED Course  I have reviewed the triage vital signs and the nursing notes.  Pertinent labs & imaging results that were available during my care of the patient were reviewed by me and considered in my medical decision making (see chart for details).  33 year old male presents with a small laceration just distal to the MCP joint of the left index finger, bleeding controlled.  Laceration is fairly superficial but due to location feel that with motion and will repeatedly be reopened so will plan for suture repair.  Wound cleaned, anesthetized and closed with 2 simple interrupted sutures with good cosmesis.  Adhesive dressing applied.  Tetanus is up-to-date.  Discussed appropriate wound care and return precautions.  Suture removal in 1 week.  Patient expresses understanding.  Discharged home in good condition.   Final Clinical Impressions(s) / ED Diagnoses   Final diagnoses:  Laceration of left index finger without foreign body without damage to nail, initial encounter    ED Discharge Orders    None       Legrand Rams 01/02/19 1734    Charlynne Pander, MD 01/02/19 2151

## 2019-01-02 NOTE — Discharge Instructions (Signed)
Your stitches need to be removed in a week.  Keep the area clean dry and covered.  It is okay to shower but do not submerge the hand underwater.  Monitor for any signs of infection such as redness, swelling, increasing pain or drainage, if these occur return for reevaluation.

## 2019-01-02 NOTE — ED Notes (Signed)
Patient verbalizes understanding of discharge instructions. Opportunity for questioning and answers were provided. Armband removed by staff, pt discharged from ED.  

## 2019-07-12 ENCOUNTER — Other Ambulatory Visit: Payer: Self-pay

## 2019-07-12 ENCOUNTER — Emergency Department (HOSPITAL_COMMUNITY)
Admission: EM | Admit: 2019-07-12 | Discharge: 2019-07-12 | Disposition: A | Discharge status: Home / Self Care | Payer: Self-pay | Attending: Emergency Medicine | Admitting: Emergency Medicine

## 2019-07-12 ENCOUNTER — Encounter (HOSPITAL_COMMUNITY): Payer: Self-pay | Admitting: Emergency Medicine

## 2019-07-12 DIAGNOSIS — Z79899 Other long term (current) drug therapy: Secondary | ICD-10-CM | POA: Insufficient documentation

## 2019-07-12 DIAGNOSIS — K0889 Other specified disorders of teeth and supporting structures: Secondary | ICD-10-CM | POA: Insufficient documentation

## 2019-07-12 DIAGNOSIS — Z87891 Personal history of nicotine dependence: Secondary | ICD-10-CM | POA: Insufficient documentation

## 2019-07-12 MED ORDER — CLINDAMYCIN HCL 300 MG PO CAPS: 300.0000 mg | ORAL_CAPSULE | Freq: Three times a day (TID) | ORAL | 0 refills | Status: AC

## 2019-07-12 MED ORDER — DICLOFENAC SODIUM 75 MG PO TBEC: 75.0000 mg | DELAYED_RELEASE_TABLET | Freq: Two times a day (BID) | ORAL | 0 refills | Status: AC

## 2019-07-12 NOTE — Discharge Instructions (Signed)
Return if any problems.

## 2019-07-12 NOTE — ED Triage Notes (Signed)
Patient c/o left upper dental pain x5 days.

## 2019-07-12 NOTE — ED Provider Notes (Signed)
[place] DEPT Provider Note   CSN: [id number] Arrival date & time: 07/12/19  1411     History Chief Complaint  Patient presents with  . Dental Pain    George Ingram is a 34 y.o. male.  The history is provided by the patient.  Dental Pain Location:  Upper Upper teeth location:  2/RU 2nd molar Quality:  Aching Severity:  Moderate Onset quality:  Gradual Timing:  Constant Progression:  Worsening Relieved by:  Nothing Worsened by:  Nothing Ineffective treatments:  None tried Pt complains of soreness in his mouth      Past Medical History:  Diagnosis Date  . Chronic back pain   . Meningitis    at age 10  . Seizures ([place])    after TBI  . TBI (traumatic brain injury) [facility]) 2011    Patient Active Problem List   Diagnosis Date Noted  . Arch pain of right foot 04/18/2014  . Acute sinusitis 04/18/2014  . TBI (traumatic brain injury) ([place]) 04/14/2014  . Hypoglycemia 04/14/2014    Past Surgical History:  Procedure Laterality Date  . BRAIN SURGERY  2011       Family History  Problem Relation Age of Onset  . Heart disease Brother     Social History   Tobacco Use  . Smoking status: Former Smoker    Quit date: 04/14/2009    Years since quitting: 10.2  . Smokeless tobacco: Never Used  Substance Use Topics  . Alcohol use: No  . Drug use: No    Home Medications Prior to Admission medications   Medication Sig Start Date End Date Taking? Authorizing Provider  albuterol (PROVENTIL HFA;VENTOLIN HFA) 108 (90 BASE) MCG/ACT inhaler Inhale 1-2 puffs into the lungs every 6 (six) hours as needed for wheezing or shortness of breath.    [provider]  clindamycin (CLEOCIN) 300 MG capsule Take 1 capsule (300 mg total) by mouth 3 (three) times daily for 10 days. 07/12/19 07/22/19  Fransico Meadow, PA-C  cyclobenzaprine (FLEXERIL) 10 MG tablet Take 1 tablet (10 mg total) by mouth 2 (two) times daily as needed for muscle spasms.  01/23/14   Sciacca, Marissa, PA-C  cyclobenzaprine (FLEXERIL) 10 MG tablet Take 1 tablet (10 mg total) by mouth 3 (three) times daily as needed. 08/18/16   Sable Feil, PA-C  diclofenac (VOLTAREN) 75 MG EC tablet Take 1 tablet (75 mg total) by mouth 2 (two) times daily. 07/12/19   Fransico Meadow, PA-C  donepezil (ARICEPT) 10 MG tablet Take 1 tablet (10 mg total) by mouth daily. 10/08/15   Vivi Barrack, MD  HYDROcodone-acetaminophen (NORCO/VICODIN) 5-325 MG tablet Take 2 tablets by mouth every 4 (four) hours as needed. 02/17/15   Delsa Grana, PA-C  ibuprofen (ADVIL,MOTRIN) 800 MG tablet Take 1 tablet (800 mg total) by mouth 3 (three) times daily. 02/17/15   Delsa Grana, PA-C  levETIRAcetam (KEPPRA) 500 MG tablet TAKE 2 TABLETS TWICE A DAY 01/30/16   Vivi Barrack, MD  naproxen (NAPROSYN) 500 MG tablet Take 1 tablet (500 mg total) by mouth 2 (two) times daily with a meal. 05/17/14   Vivi Barrack, MD  traMADol (ULTRAM) 50 MG tablet Take 1 tablet (50 mg total) by mouth every 6 (six) hours as needed for moderate pain. 08/18/16   Sable Feil, PA-C    Allergies    Patient has no known allergies.  Review of Systems   Review of Systems  All other  systems reviewed and are negative.   Physical Exam Updated Vital Signs BP 123/86 (BP Location: Right Arm)   Pulse 81   Temp 98.4 F (36.9 C) (Oral)   Resp 16   SpO2 97%   Physical Exam Vitals and nursing note reviewed.  Constitutional:      Appearance: He is well-developed.  HENT:     Head: Normocephalic and atraumatic.     Mouth/Throat:     Comments: Swelling upper gumline, tender to touch Eyes:     Conjunctiva/sclera: Conjunctivae normal.  Cardiovascular:     Rate and Rhythm: Normal rate.     Heart sounds: No murmur.  Pulmonary:     Effort: Pulmonary effort is normal. No respiratory distress.  Abdominal:     Tenderness: There is no abdominal tenderness.  Musculoskeletal:        General: Normal range of motion.     Cervical  back: Neck supple.  Skin:    General: Skin is warm and dry.  Neurological:     Mental Status: He is alert.     ED Results / Procedures / Treatments   Labs (all labs ordered are listed, but only abnormal results are displayed) Labs Reviewed - No data to display  EKG None  Radiology No results found.  Procedures Procedures (including critical care time)  Medications Ordered in ED Medications - No data to display  ED Course  I have reviewed the triage vital signs and the nursing notes.  Pertinent labs & imaging results that were available during my care of the patient were reviewed by me and considered in my medical decision making (see chart for details).    MDM Rules/Calculators/A&P                      MDM:  Pt advised to schedule to see the dentist for evaltuion  Final Clinical Impression(s) / ED Diagnoses Final diagnoses:  Pain, dental    Rx / DC Orders ED Discharge Orders         Ordered    clindamycin (CLEOCIN) 300 MG capsule  3 times daily     07/12/19 1459    diclofenac (VOLTAREN) 75 MG EC tablet  2 times daily     07/12/19 1459        An After Visit Summary was printed and given to the patient.    Osie Cheeks 07/12/19 2033    Pollyann Savoy, MD 07/14/19 [phone]

## 2020-06-20 ENCOUNTER — Ambulatory Visit (INDEPENDENT_AMBULATORY_CARE_PROVIDER_SITE_OTHER): Payer: 59

## 2020-06-20 ENCOUNTER — Ambulatory Visit
Admission: EM | Admit: 2020-06-20 | Discharge: 2020-06-20 | Disposition: A | Discharge status: Home / Self Care | Payer: 59 | Attending: Family Medicine | Admitting: Family Medicine

## 2020-06-20 ENCOUNTER — Other Ambulatory Visit: Payer: Self-pay

## 2020-06-20 DIAGNOSIS — M542 Cervicalgia: Secondary | ICD-10-CM | POA: Diagnosis not present

## 2020-06-20 DIAGNOSIS — M79671 Pain in right foot: Secondary | ICD-10-CM

## 2020-06-20 MED ORDER — CYCLOBENZAPRINE HCL 10 MG PO TABS: 10.0000 mg | ORAL_TABLET | Freq: Two times a day (BID) | ORAL | 0 refills | Status: AC | PRN

## 2020-06-20 MED ORDER — NAPROXEN 500 MG PO TABS: 500.0000 mg | ORAL_TABLET | Freq: Two times a day (BID) | ORAL | 0 refills | Status: AC | PRN
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
## 2020-07-11 ENCOUNTER — Other Ambulatory Visit: Payer: Self-pay | Admitting: Family Medicine

## 2020-07-11 DIAGNOSIS — M79671 Pain in right foot: Secondary | ICD-10-CM
